# Patient Record
Sex: Female | Born: 1960 | Race: White | Hispanic: No
Health system: Midwestern US, Community
[De-identification: ages and names within clinical notes are randomized; demographics above are authoritative.]

## PROBLEM LIST (undated history)

## (undated) DIAGNOSIS — E1165 Type 2 diabetes mellitus with hyperglycemia: Secondary | ICD-10-CM

## (undated) DIAGNOSIS — Z794 Long term (current) use of insulin: Secondary | ICD-10-CM

## (undated) DIAGNOSIS — E785 Hyperlipidemia, unspecified: Secondary | ICD-10-CM

## (undated) DIAGNOSIS — K219 Gastro-esophageal reflux disease without esophagitis: Secondary | ICD-10-CM

## (undated) DIAGNOSIS — J45909 Unspecified asthma, uncomplicated: Secondary | ICD-10-CM

## (undated) DIAGNOSIS — E119 Type 2 diabetes mellitus without complications: Secondary | ICD-10-CM

## (undated) HISTORY — PX: CHOLECYSTECTOMY: SHX55

## (undated) HISTORY — PX: TONSILLECTOMY: SUR1361

## (undated) HISTORY — PX: APPENDECTOMY: SHX54

## (undated) HISTORY — PX: COLONOSCOPY WITH ESOPHAGOGASTRODUODENOSCOPY (EGD): SHX5779

## (undated) HISTORY — PX: ABDOMINAL HYSTERECTOMY: SHX81

---

## 2015-02-28 ENCOUNTER — Encounter

## 2015-02-28 ENCOUNTER — Ambulatory Visit
Admit: 2015-02-28 | Discharge: 2015-02-28 | Payer: PRIVATE HEALTH INSURANCE | Attending: "Endocrinology | Primary: Family Medicine

## 2015-02-28 DIAGNOSIS — E1165 Type 2 diabetes mellitus with hyperglycemia: Secondary | ICD-10-CM

## 2015-02-28 DIAGNOSIS — Z794 Long term (current) use of insulin: Secondary | ICD-10-CM | POA: Insufficient documentation

## 2015-02-28 NOTE — Patient Instructions (Addendum)
Check blood sugars before meals/breakfast and at bedtime.    Low blood glucose is less than 70     Maintain the log and bring it all your appointments    If the bedtime sugars are less than 100 ,eat a 15 gm snack.    Lantus  75 units at bed time    Novolog or Humalog or Apidra insulin 25 units before breakfast, 25 units before lunch and 25 -30 units before dinner.   If sugars before meals are less than 70 then take half the scheduled dose instead of the full dose    Additional Novolog or Humalog or Apidra  for high blood sugars     150-200 mg   4 units   201-250 mg   8 units   251-300 mg   10 units   301-350 mg   14 units   351-400 mg   18 units         Exercising for 30 minutes at least 5 days per week has been shown to increase the lifespan of diabetics. We encourage an active lifestyle that includes regular exercise.     You may benefit from the Diabetic Treatment Center at [facility] [phone]     For diet information go to www. [email or web address]     Diabetes is the leading cause of blindness in the [place] It is important that you see an eye doctor every year for a dilated retinal exam     Diabetes is the leading cause of amputations in the [place] It is very important that you keep an eye on the condition of you feet. Look for any cuts, calluses, ulcers, fungal infections, rashes, or nail problems.     Diabetics need to be seen several times a year by their physician for fasting labs and monitoring of their diabetes. Prevention is the key to keeping diabetics out of trouble     Obtain a flu shot each Fall      What should you know about eating carbs?  Managing the amount of carbohydrate (carbs) you eat is an important part of healthy meals when you have diabetes. Carbohydrate is found in many foods.  ?? Learn which foods have carbs. And learn the amounts of carbs in different foods.  ?? Bread, cereal, pasta, and rice have about 15 grams of carbs in a serving. A serving is 1 slice of bread (1 ounce), ?? cup of cooked cereal, or  1/3 cup of cooked pasta or rice.  ?? Fruits have 15 grams of carbs in a serving. A serving is 1 small fresh fruit, such as an apple or orange; ?? of a banana; ?? cup of cooked or canned fruit; ?? cup of fruit juice; 1 cup of melon or raspberries; or 2 tablespoons of dried fruit.  ?? Milk and no-sugar-added yogurt have 15 grams of carbs in a serving. A serving is 1 cup of milk or 2/3 cup of no-sugar-added yogurt.  ?? Starchy vegetables have 15 grams of carbs in a serving. A serving is ?? cup of mashed potatoes or sweet potato; 1 cup winter squash; ?? of a small baked potato; ?? cup of cooked beans; or ?? cup cooked corn or green peas.  ?? Learn how much carbs to eat each day and at each meal. A dietitian or CDE can teach you how to keep track of the amount of carbs you eat. This is called carbohydrate counting.  ?? If you are not sure how to count carbohydrate  grams, use the Plate Method to plan meals. It is a good, quick way to make sure that you have a balanced meal. It also helps you spread carbs throughout the day.  ?? Divide your plate by types of foods. Put non-starchy vegetables on half the plate, meat or other protein food on one-quarter of the plate, and a grain or starchy vegetable in the final quarter of the plate. To this you can add a small piece of fruit and 1 cup of milk or yogurt, depending on how many carbs you are supposed to eat at a meal.  ?? Try to eat about the same amount of carbs at each meal. Do not "save up" your daily allowance of carbs to eat at one meal.  ?? Proteins have very little or no carbs per serving. Examples of proteins are beef, chicken, [place], fish, eggs, tofu, cheese, cottage cheese, and peanut butter. A serving size of meat is 3 ounces, which is about the size of a deck of cards. Examples of meat substitute serving sizes (equal to 1 ounce of meat) are 1/4 cup of cottage cheese, 1 egg, 1 tablespoon of peanut butter, and ?? cup of tofu.  How can you eat out and still eat healthy?  ?? Learn to  estimate the serving sizes of foods that have carbohydrate. If you measure food at home, it will be easier to estimate the amount in a serving of restaurant food.  If you eat more carbohydrate at a meal than you had planned, take a walk or do other exercise. This will help lower your blood sugar.

## 2015-02-28 NOTE — Progress Notes (Signed)
[facility] CARE DIABETES AND ENDOCRINOLOGY               Dennison Mascot ,MD        [address] [name] [address] [phone] Fax [phone]               Patient Information  Date:03/01/2015  Name : Christina Dillon 55 y.o.     D.O.B : Jun 11, 1960         Referred by: Milas Hock, MD       CC - DM     History of Present Illness: Christina Dillon is a 55 y.o. female here for initial visit of  Type 2 Diabetes Mellitus.     Type 2 Diabetes was diagnosed in 2010 . End organ effects of diabetes: none.    Cardiovascular risk factors: dyslipidemia, diabetes mellitus, obesity, stress   Monitoring frequency:2 /day and readings run 60 - 190  Hypoglycemia: yes  Eye exam :  no  Weight trend: increasing steadily  Prior visit with dietician: no  Current diet: "unhealthy" diet in general  Current exercise: no regular exercise    Wt Readings from Last 3 Encounters:   02/28/15 207 lb (93.9 kg)       BP Readings from Last 3 Encounters:   02/28/15 130/75           Past Medical History   Diagnosis Date   ??? Kidney stones    ??? Type II diabetes mellitus, uncontrolled (HCC)      Current Outpatient Prescriptions   Medication Sig   ??? insulin glargine (LANTUS SOLOSTAR) 100 unit/mL (3 mL) pen 85 Units by SubCUTAneous route daily.   ??? insulin aspart (NOVOLOG FLEXPEN) 100 unit/mL inpn 20 Units by SubCUTAneous route Before breakfast, lunch, and dinner.   ??? metFORMIN (GLUCOPHAGE) 1,000 mg tablet Take 1,000 mg by mouth two (2) times daily (with meals).   ??? meloxicam (MOBIC) 15 mg tablet Take 15 mg by mouth daily.   ??? CIPROFLOXACIN HCL (CIPRO PO) Take  by mouth.   ??? phenazopyridine (PYRIDIUM) 200 mg tablet Take  by mouth three (3) times daily as needed for Pain.   ??? linaclotide (LINZESS) 145 mcg cap capsule Take  by mouth Daily (before breakfast).     No current facility-administered medications for this visit.      Allergies   Allergen Reactions   ??? Iodinated Contrast Media - Oral And Iv Dye Anaphylaxis   ??? Morphine Anaphylaxis   ??? Pcn  [Penicillins] Anaphylaxis   ??? Codeine Rash and Swelling   ??? Sulfa (Sulfonamide Antibiotics) Rash and Swelling     Throat closes         Review of Systems:  - Constitutional Symptoms: no fevers, no chills, no weight loss  - Eyes: no blurry vision no double vision  - Cardiovascular: no chest pain ,no palpitations  - Respiratory: no cough no shortness of breath  - Gastrointestinal: no dysphagia no  abdominal pain  - Musculoskeletal: no joint pains no  weakness  - Integumentary: no rashes  - Neurological: no numbness, tingling, no  headaches  - Psychiatric: no depression no  anxiety  - Endocrine: no heat or cold intolerance    Physical Examination:   Blood pressure 130/75, pulse 90, temperature 98.5 ??F (36.9 ??C), temperature source Oral, resp. rate 16, height 5\' 1"  (1.549 m), weight 207 lb (93.9 kg), SpO2 95 %. Estimated body mass index is 39.11 kg/(m^2) as calculated from the following:  Height as of this encounter: 5\' 1"  (1.549 m).  -   Weight as of this encounter: 207 lb (93.9 kg).  - General: pleasant, no distress, good eye contact  - HEENT: no pallor, no periorbital edema, EOMI  - Neck: supple, no thyromegaly, no nodules  - Cardiovascular: regular, normal rate, normal S1 and S2, no murmurs  - Respiratory: clear to auscultation bilaterally  - Gastrointestinal: soft, nontender, nondistended,  BS +  - Musculoskeletal: no proximal muscle weakness in upper or lower extremities  - Integumentary: no acanthosis nigricans,no edema, no foot ulcers  - Neurological: intact sensation to monofilament ,alert and oriented  - Psychiatric: normal mood and affect  - Skin: color, texture, turgor normal.       Data Reviewed:     []  Glucose records reviewed.  []  See glucose records for details (to be scanned).  []  A1C  []  Reviewed labs        Assessment/Plan:     1. Type 2 diabetes mellitus with hyperglycemia, with long-term current use of insulin (HCC)        1. 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   Metformin  Trulicity - weight loss discussed ,portion control   Advised to check glucose 2  times daily    Diabetic issues reviewed : glycemic goals , written exchange diet given, low carbohydrate diet, weight control , home glucose monitoring emphasized,  hypoglycemia management and long term diabetic complications discussed.   FLU annually ,Pneumovax ,aspirin daily,annual eye exam,microalbumin      4.Obesity:Body mass index is 39.11 kg/(m^2).  Discussed about the importance of exercise and carbohydrate portion control.          Patient Instructions   Check blood sugars before meals/breakfast and at bedtime.    Low blood glucose is less than 70     Maintain the log and bring it all your appointments    If the bedtime sugars are less than 100 ,eat a 15 gm snack.    Lantus  75 units at bed time    Novolog or Humalog or Apidra insulin 25 units before breakfast, 25 units before lunch and 25 -30 units before dinner.   If sugars before meals are less than 70 then take half the scheduled dose instead of the full dose    Additional Novolog or Humalog or Apidra  for high blood sugars     150-200 mg   4 units   201-250 mg   8 units   251-300 mg   10 units   301-350 mg   14 units   351-400 mg   18 units         Exercising for 30 minutes at least 5 days per week has been shown to increase the lifespan of diabetics. We encourage an active lifestyle that includes regular exercise.     You may benefit from the Diabetic Treatment Center at [facility] [phone]     For diet information go to www. [email or web address]     Diabetes is the leading cause of blindness in the [place] It is important that you see an eye doctor every year for a dilated retinal exam     Diabetes is the leading cause of amputations in the [place] It  is very important  that you keep an eye on the condition of you feet. Look for any cuts, calluses, ulcers, fungal infections, rashes, or nail problems.     Diabetics need to be seen several times a year by their physician for fasting labs and monitoring of their diabetes. Prevention is the key to keeping diabetics out of trouble     Obtain a flu shot each Fall      What should you know about eating carbs?  Managing the amount of carbohydrate (carbs) you eat is an important part of healthy meals when you have diabetes. Carbohydrate is found in many foods.  ?? Learn which foods have carbs. And learn the amounts of carbs in different foods.  ?? Bread, cereal, pasta, and rice have about 15 grams of carbs in a serving. A serving is 1 slice of bread (1 ounce), ?? cup of cooked cereal, or 1/3 cup of cooked pasta or rice.  ?? Fruits have 15 grams of carbs in a serving. A serving is 1 small fresh fruit, such as an apple or orange; ?? of a banana; ?? cup of cooked or canned fruit; ?? cup of fruit juice; 1 cup of melon or raspberries; or 2 tablespoons of dried fruit.  ?? Milk and no-sugar-added yogurt have 15 grams of carbs in a serving. A serving is 1 cup of milk or 2/3 cup of no-sugar-added yogurt.  ?? Starchy vegetables have 15 grams of carbs in a serving. A serving is ?? cup of mashed potatoes or sweet potato; 1 cup winter squash; ?? of a small baked potato; ?? cup of cooked beans; or ?? cup cooked corn or green peas.  ?? Learn how much carbs to eat each day and at each meal. A dietitian or CDE can teach you how to keep track of the amount of carbs you eat. This is called carbohydrate counting.  ?? If you are not sure how to count carbohydrate grams, use the Plate Method to plan meals. It is a good, quick way to make sure that you have a balanced meal. It also helps you spread carbs throughout the day.  ?? Divide your plate by types of foods. Put non-starchy vegetables on half the plate, meat or other protein food on one-quarter of the plate, and a grain or  starchy vegetable in the final quarter of the plate. To this you can add a small piece of fruit and 1 cup of milk or yogurt, depending on how many carbs you are supposed to eat at a meal.  ?? Try to eat about the same amount of carbs at each meal. Do not "save up" your daily allowance of carbs to eat at one meal.  ?? Proteins have very little or no carbs per serving. Examples of proteins are beef, chicken, [place], fish, eggs, tofu, cheese, cottage cheese, and peanut butter. A serving size of meat is 3 ounces, which is about the size of a deck of cards. Examples of meat substitute serving sizes (equal to 1 ounce of meat) are 1/4 cup of cottage cheese, 1 egg, 1 tablespoon of peanut butter, and ?? cup of tofu.  How can you eat out and still eat healthy?  ?? Learn to estimate the serving sizes of foods that have carbohydrate. If you measure food at home, it will be easier to estimate the amount in a serving of restaurant food.  If you eat more carbohydrate at a meal than you had planned, take a walk  or do other exercise. This will help lower your blood sugar.    Follow-up Disposition:  Return in about 2 months (around 04/28/2015).    Thank you for allowing me to participate in the care of this patient.    Dennison Mascot, MD      Patient verbalized understanding

## 2015-02-28 NOTE — Progress Notes (Signed)
Eye exam: couple of years ago  Foot exam: never  Diabetic since 2010    Wt Readings from Last 3 Encounters:   02/28/15 207 lb (93.9 kg)     Temp Readings from Last 3 Encounters:   02/28/15 98.5 ??F (36.9 ??C) (Oral)     BP Readings from Last 3 Encounters:   02/28/15 130/75     Pulse Readings from Last 3 Encounters:   02/28/15 90

## 2015-03-01 DIAGNOSIS — Z6839 Body mass index (BMI) 39.0-39.9, adult: Secondary | ICD-10-CM | POA: Insufficient documentation

## 2015-03-03 MED ORDER — DULAGLUTIDE 0.75 MG/0.5 ML SUBCUTANEOUS PEN INJECTOR
0.75 mg/0.5 mL | INJECTION | SUBCUTANEOUS | 5 refills | Status: DC
Start: 2015-03-03 — End: 2016-02-28

## 2015-05-02 ENCOUNTER — Ambulatory Visit
Admit: 2015-05-02 | Discharge: 2015-05-02 | Payer: PRIVATE HEALTH INSURANCE | Attending: "Endocrinology | Primary: Family Medicine

## 2015-05-02 ENCOUNTER — Encounter

## 2015-05-02 DIAGNOSIS — E1165 Type 2 diabetes mellitus with hyperglycemia: Secondary | ICD-10-CM

## 2015-05-02 LAB — AMB POC HEMOGLOBIN A1C: Hemoglobin A1c (POC): 7 %

## 2015-05-02 LAB — AMB POC GLUCOSE, QUANTITATIVE, BLOOD: Glucose POC: 182 mg/dL

## 2015-05-02 MED ORDER — INSULIN GLARGINE 100 UNIT/ML (3 ML) SUB-Q PEN
100 unit/mL (3 mL) | Freq: Every day | SUBCUTANEOUS | 5 refills | Status: DC
Start: 2015-05-02 — End: 2015-05-06

## 2015-05-02 NOTE — Patient Instructions (Signed)
Check blood sugars before meals/breakfast and at bedtime.    Low blood glucose is less than 70     Maintain the log and bring it all your appointments    If the bedtime sugars are less than 100 ,eat a 15 gm snack.    Lantus  70 units at bed time      Stop Humalog [facility]/NOvolog       Start Trulicity once a week     If you have blood sugars less than 80 , then cut back on lantus by 5 units at a time and keep monitoring

## 2015-05-02 NOTE — Progress Notes (Signed)
Christina Dillon is a 55 y.o. female here for   Chief Complaint   Patient presents with   ??? Diabetes       Functional glucose monitor and record keeping system? - yes  Eye exam within last year? - no  Foot exam within last year? - no    No results found for: HBA1C, HGBE8, HBA1CPOC, HBA1CEXT    Wt Readings from Last 3 Encounters:   02/28/15 207 lb (93.9 kg)     Temp Readings from Last 3 Encounters:   02/28/15 98.5 ??F (36.9 ??C) (Oral)     BP Readings from Last 3 Encounters:   02/28/15 130/75     Pulse Readings from Last 3 Encounters:   02/28/15 90

## 2015-05-02 NOTE — Progress Notes (Signed)
[facility] CARE DIABETES AND ENDOCRINOLOGY               Dennison Mascot ,MD        [address] [place] [address] [phone] Fax [phone]               Patient Information  Date:05/04/2015  Name : Christina Dillon 55 y.o.     D.O.B : Apr 29, 1960         Referred by: Milas Hock, DO       CC - DM     History of Present Illness: Christina Dillon is a 55 y.o. female here for fu  of  Type 2 Diabetes Mellitus.     Type 2 Diabetes was diagnosed in 2010 . End organ effects of diabetes: none.    Cardiovascular risk factors: dyslipidemia, diabetes mellitus, obesity, stress   Monitoring frequency:2 /day and readings run 60 - 130 ,   Hypoglycemia: yes  Checking glucose at home      She has changed the diet and has hypos now             Current exercise: no regular exercise    Wt Readings from Last 3 Encounters:   05/02/15 206 lb 14.4 oz (93.8 kg)   02/28/15 207 lb (93.9 kg)       BP Readings from Last 3 Encounters:   05/02/15 128/77   02/28/15 130/75           Past Medical History:   Diagnosis Date   ??? Kidney stones    ??? Type II diabetes mellitus, uncontrolled (HCC)      Current Outpatient Prescriptions   Medication Sig   ??? oxybutynin chloride XL (DITROPAN XL) 5 mg CR tablet    ??? insulin aspart (NOVOLOG FLEXPEN) 100 unit/mL inpn 20 Units by SubCUTAneous route Before breakfast, lunch, and dinner.   ??? metFORMIN (GLUCOPHAGE) 1,000 mg tablet Take 1,000 mg by mouth two (2) times daily (with meals).   ??? phenazopyridine (PYRIDIUM) 200 mg tablet Take  by mouth three (3) times daily as needed for Pain.   ??? linaclotide (LINZESS) 145 mcg cap capsule Take  by mouth Daily (before breakfast).   ??? insulin glargine (LANTUS SOLOSTAR) 100 unit/mL (3 mL) pen 70 Units by SubCUTAneous route daily.   ??? dulaglutide (TRULICITY) 0.75 mg/0.5 mL sub-q pen 0.5 mL by SubCUTAneous route every seven (7) days.     No current facility-administered medications for this visit.      Allergies   Allergen Reactions   ??? Iodinated Contrast Media - Oral  And Iv Dye Anaphylaxis   ??? Morphine Anaphylaxis   ??? Pcn [Penicillins] Anaphylaxis   ??? Codeine Rash and Swelling   ??? Sulfa (Sulfonamide Antibiotics) Rash and Swelling     Throat closes         Review of Systems:  -   - Musculoskeletal: no joint pains no  weakness  - Integumentary: no rashes  - Neurological: no numbness, tingling, no  headaches  - Psychiatric: no depression no  anxiety  - Endocrine: no heat or cold intolerance    Physical Examination:   Blood pressure 128/77, pulse 95, temperature 98.6 ??F (37 ??C), temperature source Oral, resp. rate 20, height  (1.549 m), weight 206 lb 14.4 oz (93.8 kg). Estimated body mass index is 39.09 kg/(m^2) as calculated from the following:    Height as of this encounter:  (1.549 m).  -   Weight as  of this encounter: 206 lb 14.4 oz (93.8 kg).  - General: pleasant, no distress, good eye contact  - HEENT: no pallor, no periorbital edema, EOMI  - Neck: supple, no thyromegaly, no nodules  - Cardiovascular: regular, normal rate, normal S1 and S2, no murmurs  - Respiratory: clear to auscultation bilaterally  - Gastrointestinal: soft, nontender, nondistended,  BS +  - Musculoskeletal: no proximal muscle weakness in upper or lower extremities  -   - Psychiatric: normal mood and affect  - Skin: color, texture, turgor normal.       Data Reviewed:     []  Glucose records reviewed.  []  See glucose records for details (to be scanned).  []  A1C  []  Reviewed labs        Assessment/Plan:     1. Type 2 diabetes mellitus with hyperglycemia, with long-term current use of insulin (HCC)        1. 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  Discussed about the importance of exercise and carbohydrate portion control.          Patient Instructions   Check blood sugars before meals/breakfast and at bedtime.    Low blood glucose is less than 70     Maintain the log and bring it all your appointments    If the bedtime sugars are less than 100 ,eat a 15 gm snack.    Lantus  70 units at bed time      Stop Humalog [facility]/NOvolog       Start Trulicity once a week     If you have blood sugars less than 80 , then cut back on lantus by 5 units at a time and keep monitoring     Follow-up Disposition:  Return in about 3 months (around 08/02/2015).    Thank you for allowing me to participate in the care of this patient.    Dennison MascotUma Easton Sivertson, MD      Patient verbalized understanding

## 2015-05-04 DIAGNOSIS — I10 Essential (primary) hypertension: Secondary | ICD-10-CM | POA: Insufficient documentation

## 2015-05-06 ENCOUNTER — Encounter

## 2015-05-06 MED ORDER — INSULIN DETEMIR 100 UNIT/ML (3 ML) SUB-Q PEN
100 unit/mL (3 mL) | Freq: Every day | SUBCUTANEOUS | 11 refills | Status: DC
Start: 2015-05-06 — End: 2015-11-05

## 2015-05-07 NOTE — Telephone Encounter (Signed)
Patient called sugars have been out of ranges. Was wondering if you can help. [phone]

## 2015-05-12 NOTE — Telephone Encounter (Signed)
Called patient and left message.

## 2015-06-30 NOTE — Telephone Encounter (Signed)
Attempted to call patient left voicemail and call back number.

## 2015-07-01 NOTE — Telephone Encounter (Signed)
Spoke with patient stated blood sugars are still fluctuating. Patient verbalizes what to do for low blood sugar. Said she spoke with Dr.Muthyala when her husband had an appointment and felt better about blood sugars. Instructed patient to call for any issues. Patient verbalized understanding. Patient has follow up appointment on 07/31/2015.

## 2015-07-31 ENCOUNTER — Encounter: Admit: 2015-07-31 | Discharge: 2015-07-31 | Payer: PRIVATE HEALTH INSURANCE | Primary: Family Medicine

## 2015-07-31 DIAGNOSIS — E1165 Type 2 diabetes mellitus with hyperglycemia: Secondary | ICD-10-CM

## 2015-08-01 LAB — METABOLIC PANEL, COMPREHENSIVE
A-G Ratio: 1.8 (ref 1.2–2.2)
ALT (SGPT): 19 IU/L (ref 0–32)
AST (SGOT): 13 IU/L (ref 0–40)
Albumin: 4.1 g/dL (ref 3.5–5.5)
Alk. phosphatase: 76 IU/L (ref 39–117)
BUN/Creatinine ratio: 20 (ref 9–23)
BUN: 12 mg/dL (ref 6–24)
Bilirubin, total: 0.3 mg/dL (ref 0.0–1.2)
CO2: 24 mmol/L (ref 18–29)
Calcium: 8.9 mg/dL (ref 8.7–10.2)
Chloride: 105 mmol/L (ref 96–106)
Creatinine: 0.6 mg/dL (ref 0.57–1.00)
GFR est AA: 121 mL/min/{1.73_m2} (ref >59)
GFR est non-AA: 105 mL/min/{1.73_m2} (ref >59)
GLOBULIN, TOTAL: 2.3 g/dL (ref 1.5–4.5)
Glucose: 83 mg/dL (ref 65–99)
Potassium: 4.1 mmol/L (ref 3.5–5.2)
Protein, total: 6.4 g/dL (ref 6.0–8.5)
Sodium: 145 mmol/L — ABNORMAL HIGH (ref 134–144)

## 2015-08-01 LAB — MICROALBUMIN, UR, RAND W/ MICROALB/CREAT RATIO
Creatinine, urine random: 125.1 mg/dL
Microalb/Creat ratio (ug/mg creat.): 9.9 mg/g creat (ref 0.0–30.0)
Microalbumin, urine: 12.4 ug/mL

## 2015-08-01 LAB — HEMOGLOBIN A1C WITH EAG
Estimated average glucose: 146 mg/dL
Hemoglobin A1c: 6.7 % — ABNORMAL HIGH (ref 4.8–5.6)

## 2015-08-01 LAB — LDL, DIRECT: LDL,Direct: 118 mg/dL — ABNORMAL HIGH (ref 0–99)

## 2015-08-07 ENCOUNTER — Encounter: Attending: "Endocrinology | Primary: Family Medicine

## 2015-10-05 MED ORDER — NOVOLOG FLEXPEN U-100 INSULIN ASPART 100 UNIT/ML (3 ML) SUBCUTANEOUS
100 unit/mL (3 mL) | SUBCUTANEOUS | 1 refills | Status: DC
Start: 2015-10-05 — End: 2016-06-24

## 2015-11-05 ENCOUNTER — Ambulatory Visit
Admit: 2015-11-05 | Discharge: 2015-11-05 | Payer: PRIVATE HEALTH INSURANCE | Attending: "Endocrinology | Primary: Family Medicine

## 2015-11-05 DIAGNOSIS — E1165 Type 2 diabetes mellitus with hyperglycemia: Secondary | ICD-10-CM

## 2015-11-05 DIAGNOSIS — E162 Hypoglycemia, unspecified: Secondary | ICD-10-CM | POA: Insufficient documentation

## 2015-11-05 LAB — AMB POC HEMOGLOBIN A1C: Hemoglobin A1c (POC): 6.4 %

## 2015-11-05 LAB — AMB POC GLUCOSE, QUANTITATIVE, BLOOD: Glucose POC: 75 mg/dL

## 2015-11-05 MED ORDER — INSULIN DETEMIR 100 UNIT/ML (3 ML) SUB-Q PEN
100 unit/mL (3 mL) | Freq: Every day | SUBCUTANEOUS | 11 refills | Status: DC
Start: 2015-11-05 — End: 2015-12-11

## 2015-11-05 NOTE — Patient Instructions (Addendum)
Check blood sugars before meals/breakfast and at bedtime.    Low blood glucose is less than 70     Maintain the log and bring it all your appointments    If the bedtime sugars are less than 100 ,eat a 15 gm snack.    Levemir 60 units at bed time on work nights     Levemir 70 units at bedtime when not working     Novolog 15 units before meals

## 2015-11-05 NOTE — Progress Notes (Signed)
Leia Alfamela Kendrix is a 55 y.o. female here for   Chief Complaint   Patient presents with   ??? Diabetes       Functional glucose monitor and record keeping system? - yes  Eye exam within last year? - no  Foot exam within last year? - no    1. Have you been to the ER, urgent care clinic since your last visit?  Hospitalized since your last visit? -[facility] for kidney stones and septic UTI    2. Have you seen or consulted any other health care providers [facility] since your last visit?  Include any pap smears or colon screening.-PCP      Lab Results   Component Value Date/Time    Hemoglobin A1c 6.7 07/31/2015 09:23 AM    Hemoglobin A1c (POC) 7 05/02/2015 01:47 PM       Wt Readings from Last 3 Encounters:   05/02/15 206 lb 14.4 oz (93.8 kg)   02/28/15 207 lb (93.9 kg)     Temp Readings from Last 3 Encounters:   05/02/15 98.6 ??F (37 ??C) (Oral)   02/28/15 98.5 ??F (36.9 ??C) (Oral)     BP Readings from Last 3 Encounters:   05/02/15 128/77   02/28/15 130/75     Pulse Readings from Last 3 Encounters:   05/02/15 95   02/28/15 90

## 2015-11-05 NOTE — Progress Notes (Signed)
[facility]               Dennison MascotUma  Ellese Julius ,MD        [address] [place] [id number] [phone]:[phone] Fax [phone]               Patient Information  Date:11/05/2015  Name : Christina Dillon 55 y.o.     D.O.B : 1960-12-07         Referred by: Milas HockJohn Lewis, DO       CC - DM     History of Present Illness: Christina Alfamela Tewell is a 55 y.o. female here for fu  of  Type 2 Diabetes Mellitus.     Type 2 Diabetes was diagnosed in 2010 . End organ effects of diabetes: none.    Cardiovascular risk factors: dyslipidemia, diabetes mellitus, obesity, stress   Monitoring frequency:2 /day and readings run 60 - 130 ,   Hypoglycemia: yes  Checking glucose at home   when she works nights she has hypoglycemia  Still skipping meals and today she has not eaten breakfast or lunch       Current exercise: no regular exercise    Wt Readings from Last 3 Encounters:   11/05/15 197 lb 6.4 oz (89.5 kg)   05/02/15 206 lb 14.4 oz (93.8 kg)   02/28/15 207 lb (93.9 kg)       BP Readings from Last 3 Encounters:   11/05/15 127/79   05/02/15 128/77   02/28/15 130/75           Past Medical History:   Diagnosis Date   ??? Kidney stones    ??? Type II diabetes mellitus, uncontrolled (HCC)      Current Outpatient Prescriptions   Medication Sig   ??? meclizine (ANTIVERT) 25 mg tablet Take 25 mg by mouth.   ??? NOVOLOG FLEXPEN 100 unit/mL inpn INJECT 15 UNITS SUBCUTANEOUSLY WITH MEALS   ??? insulin detemir (LEVEMIR FLEXTOUCH) 100 unit/mL (3 mL) inpn 70 Units by SubCUTAneous route daily.   ??? oxybutynin chloride XL (DITROPAN XL) 5 mg CR tablet    ??? metFORMIN (GLUCOPHAGE) 1,000 mg tablet Take 1,000 mg by mouth two (2) times daily (with meals).   ??? phenazopyridine (PYRIDIUM) 200 mg tablet Take  by mouth three (3) times daily as needed for Pain.   ??? linaclotide (LINZESS) 145 mcg cap capsule Take  by mouth Daily (before breakfast).   ??? dulaglutide (TRULICITY) 0.75 mg/0.5 mL sub-q pen 0.5 mL by SubCUTAneous route every seven (7) days.     No  current facility-administered medications for this visit.      Allergies   Allergen Reactions   ??? Iodinated Contrast- Oral And Iv Dye Anaphylaxis   ??? Morphine Anaphylaxis   ??? Pcn [Penicillins] Anaphylaxis   ??? Codeine Rash and Swelling   ??? Sulfa (Sulfonamide Antibiotics) Rash and Swelling     Throat closes         Review of Systems:  -   - Musculoskeletal: no joint pains no  weakness  - Integumentary: no rashes  - Neurological: no numbness, tingling, no  headaches  - Psychiatric: no depression no  anxiety  - Endocrine: no heat or cold intolerance    Physical Examination:   Blood pressure 127/79, pulse 87, temperature 98.6 ??F (37 ??C), temperature source Oral, resp. rate 16, height 5\' 1"  (1.549 m), weight 197 lb 6.4 oz (89.5 kg), SpO2 96 %. Estimated body mass index is 37.3 kg/(m^2)  as calculated from the following:    Height as of this encounter: 5\' 1"  (1.549 m).  -   Weight as of this encounter: 197 lb 6.4 oz (89.5 kg).  - General: pleasant, no distress, good eye contact  - HEENT: no pallor, no periorbital edema, EOMI  - Neck: supple, no thyromegaly, no nodules  - Cardiovascular: regular, normal rate, normal S1 and S2, no murmurs  - Respiratory: clear to auscultation bilaterally  - Gastrointestinal: soft, nontender, nondistended,  BS +  - Musculoskeletal: no proximal muscle weakness in upper or lower extremities  -   - Psychiatric: normal mood and affect  - Skin: color, texture, turgor normal.       Data Reviewed:     []  Glucose records reviewed.  []  See glucose records for details (to be scanned).  []  A1C  []  Reviewed labs        Assessment/Plan:     1. Type 2 diabetes mellitus with hyperglycemia, with long-term current use of insulin (HCC)    2. Essential hypertension        1. Type 2 Diabetes Mellitus with no nephropathy,neuropathy,retinopathy  Lab Results   Component Value Date/Time    Hemoglobin A1c 6.7 07/31/2015 09:23 AM    Hemoglobin A1c (POC) 7 05/02/2015 01:47 PM     Levemir 60 units at bed time on work  nights     Levemir 70 units at bedtime when not working     Metformin  Trulicity -  Advised to check glucose 2  times daily    FLU annually ,Pneumovax ,aspirin daily,annual eye exam,microalbumin      HTN - controlled     4.Obesity:Body mass index is 37.3 kg/(m^2).  Discussed about the importance of exercise and carbohydrate portion control.          There are no Patient Instructions on file for this visit.  Follow-up Disposition: Not on File    Thank you for allowing me to participate in the care of this patient.    Dennison Mascot, MD      Patient verbalized understanding

## 2015-12-11 ENCOUNTER — Encounter

## 2015-12-11 MED ORDER — INSULIN DETEMIR 100 UNIT/ML (3 ML) SUB-Q PEN
100 unit/mL (3 mL) | Freq: Every day | SUBCUTANEOUS | 3 refills | Status: DC
Start: 2015-12-11 — End: 2016-07-03

## 2016-02-28 ENCOUNTER — Encounter

## 2016-02-28 MED ORDER — TRULICITY 0.75 MG/0.5 ML SUBCUTANEOUS PEN INJECTOR
0.75 mg/0.5 mL | INJECTION | SUBCUTANEOUS | 3 refills | Status: DC
Start: 2016-02-28 — End: 2016-07-25

## 2016-03-03 ENCOUNTER — Encounter: Admit: 2016-03-03 | Discharge: 2016-03-03 | Payer: PRIVATE HEALTH INSURANCE | Primary: Family Medicine

## 2016-03-03 DIAGNOSIS — E1165 Type 2 diabetes mellitus with hyperglycemia: Secondary | ICD-10-CM

## 2016-03-04 LAB — LIPID PANEL
Cholesterol, total: 211 mg/dL — ABNORMAL HIGH (ref 100–199)
HDL Cholesterol: 49 mg/dL (ref >39)
LDL, calculated: 133 mg/dL — ABNORMAL HIGH (ref 0–99)
Triglyceride: 143 mg/dL (ref 0–149)
VLDL, calculated: 29 mg/dL (ref 5–40)

## 2016-03-04 LAB — MICROALBUMIN, UR, RAND W/ MICROALB/CREAT RATIO
Creatinine, urine random: 87.4 mg/dL
Microalb/Creat ratio (ug/mg creat.): 22.2 mg/g creat (ref 0.0–30.0)
Microalbumin, urine: 19.4 ug/mL

## 2016-03-04 LAB — METABOLIC PANEL, COMPREHENSIVE
A-G Ratio: 1.6 (ref 1.2–2.2)
ALT (SGPT): 24 IU/L (ref 0–32)
AST (SGOT): 14 IU/L (ref 0–40)
Albumin: 4.1 g/dL (ref 3.5–5.5)
Alk. phosphatase: 76 IU/L (ref 39–117)
BUN/Creatinine ratio: 17 (ref 9–23)
BUN: 10 mg/dL (ref 6–24)
Bilirubin, total: 0.2 mg/dL (ref 0.0–1.2)
CO2: 22 mmol/L (ref 18–29)
Calcium: 9.1 mg/dL (ref 8.7–10.2)
Chloride: 106 mmol/L (ref 96–106)
Creatinine: 0.59 mg/dL (ref 0.57–1.00)
GFR est AA: 121 mL/min/{1.73_m2} (ref >59)
GFR est non-AA: 105 mL/min/{1.73_m2} (ref >59)
GLOBULIN, TOTAL: 2.5 g/dL (ref 1.5–4.5)
Glucose: 96 mg/dL (ref 65–99)
Potassium: 4.2 mmol/L (ref 3.5–5.2)
Protein, total: 6.6 g/dL (ref 6.0–8.5)
Sodium: 146 mmol/L — ABNORMAL HIGH (ref 134–144)

## 2016-03-04 LAB — DIABETES PATIENT EDUCATION

## 2016-03-04 LAB — HEMOGLOBIN A1C WITH EAG
Estimated average glucose: 171 mg/dL
Hemoglobin A1c: 7.6 % — ABNORMAL HIGH (ref 4.8–5.6)

## 2016-03-04 LAB — CVD REPORT

## 2016-03-10 ENCOUNTER — Ambulatory Visit
Admit: 2016-03-10 | Discharge: 2016-03-10 | Payer: PRIVATE HEALTH INSURANCE | Attending: "Endocrinology | Primary: Family Medicine

## 2016-03-10 ENCOUNTER — Encounter

## 2016-03-10 DIAGNOSIS — E1165 Type 2 diabetes mellitus with hyperglycemia: Secondary | ICD-10-CM

## 2016-03-10 MED ORDER — PRAVASTATIN 20 MG TAB
20 mg | ORAL_TABLET | Freq: Every evening | ORAL | 5 refills | Status: DC
Start: 2016-03-10 — End: 2016-06-10

## 2016-03-10 MED ORDER — LANCETS 33 GAUGE
33 gauge | 11 refills | Status: DC
Start: 2016-03-10 — End: 2017-06-23

## 2016-03-10 MED ORDER — BLOOD SUGAR DIAGNOSTIC TEST STRIPS
ORAL_STRIP | 11 refills | Status: DC
Start: 2016-03-10 — End: 2017-06-15

## 2016-03-10 NOTE — Progress Notes (Signed)
Christina Dillon is a 56 y.o. female here for   Chief Complaint   Patient presents with   ??? Diabetes       Functional glucose monitor and record keeping system? - yes  Eye exam within last year? - no, need referral      1. Have you been to the ER, urgent care clinic since your last visit?  Hospitalized since your last visit? [facility] for Kidney stones, sepsis, mrsa , in Aug 2017    2. Have you seen or consulted any other health care providers outside of the [facility] since your last visit?  Include any pap smears or colon screening.-PCP      Lab Results   Component Value Date/Time    Hemoglobin A1c 7.6 03/03/2016 08:41 AM    Hemoglobin A1c (POC) 6.4 11/05/2015 01:33 PM       Wt Readings from Last 3 Encounters:   11/05/15 197 lb 6.4 oz (89.5 kg)   05/02/15 206 lb 14.4 oz (93.8 kg)   02/28/15 207 lb (93.9 kg)     Temp Readings from Last 3 Encounters:   11/05/15 98.6 ??F (37 ??C) (Oral)   05/02/15 98.6 ??F (37 ??C) (Oral)   02/28/15 98.5 ??F (36.9 ??C) (Oral)     BP Readings from Last 3 Encounters:   11/05/15 127/79   05/02/15 128/77   02/28/15 130/75     Pulse Readings from Last 3 Encounters:   11/05/15 87   05/02/15 95   02/28/15 90

## 2016-03-10 NOTE — Patient Instructions (Signed)
Check blood sugars before meals/breakfast and at bedtime.    Low blood glucose is less than 70     Maintain the log and bring it all your appointments    If the bedtime sugars are less than 100 ,eat a 15 gm snack.    Levemir 60 units at bed time     Novolog 15 units before meals     When glucose is less than 70 - no Novolog

## 2016-03-10 NOTE — Progress Notes (Signed)
[facility]               Dennison MascotUma  Jamariya Davidoff ,MD        [address] [place] [place] [id number] [phone]:[phone] Fax [phone]               Patient Information  Date:03/11/2016  Name : Christina Dillon 56 y.o.     D.O.B : July 27, 1960         Referred by: Milas HockJohn Lewis, DO       CC - DM     History of Present Illness: Christina Alfamela Gloor is a 56 y.o. female here for fu  of  Type 2 Diabetes Mellitus.     Type 2 Diabetes was diagnosed in 2010 . End organ effects of diabetes: none.    Cardiovascular risk factors: dyslipidemia, diabetes mellitus, obesity, stress   Monitoring frequency:2 /day   Hypoglycemia: yes  Checking glucose at home,   when she works nights she has hypoglycemia, missing meals,   She wants to keep her blood glucose 80-100   She has not eaten anything today.  The last 3 weeks she is off work, no hypoglycemia      Current exercise: no regular exercise    Wt Readings from Last 3 Encounters:   03/10/16 215 lb 11.2 oz (97.8 kg)   11/05/15 197 lb 6.4 oz (89.5 kg)   05/02/15 206 lb 14.4 oz (93.8 kg)       BP Readings from Last 3 Encounters:   03/10/16 146/73   11/05/15 127/79   05/02/15 128/77           Past Medical History:   Diagnosis Date   ??? Kidney stones    ??? Type II diabetes mellitus, uncontrolled (HCC)      Current Outpatient Prescriptions   Medication Sig   ??? TRULICITY 0.75 mg/0.5 mL sub-q pen INJECT 0.5ML SUBCUTANEOUSLY EVERY 7 DAYS   ??? insulin detemir (LEVEMIR FLEXTOUCH) 100 unit/mL (3 mL) inpn 60 Units by SubCUTAneous route daily.   ??? meclizine (ANTIVERT) 25 mg tablet Take 25 mg by mouth.   ??? NOVOLOG FLEXPEN 100 unit/mL inpn INJECT 15 UNITS SUBCUTANEOUSLY WITH MEALS   ??? oxybutynin chloride XL (DITROPAN XL) 5 mg CR tablet    ??? metFORMIN (GLUCOPHAGE) 1,000 mg tablet Take 1,000 mg by mouth two (2) times daily (with meals).   ??? phenazopyridine (PYRIDIUM) 200 mg tablet Take  by mouth three (3) times daily as needed for Pain.   ??? linaclotide (LINZESS) 145 mcg cap capsule Take  by  mouth Daily (before breakfast).   ??? glucose blood VI test strips (ONETOUCH VERIO) strip Test 3 times daily Dx Code: E11.65   ??? lancets (ONE TOUCH DELICA) 33 gauge misc Test 3 times daily Dx Code: E11.65   ??? pravastatin (PRAVACHOL) 20 mg tablet Take 1 Tab by mouth nightly.     No current facility-administered medications for this visit.      Allergies   Allergen Reactions   ??? Iodinated Contrast- Oral And Iv Dye Anaphylaxis   ??? Morphine Anaphylaxis   ??? Pcn [Penicillins] Anaphylaxis   ??? Codeine Rash and Swelling   ??? Sulfa (Sulfonamide Antibiotics) Rash and Swelling     Throat closes         Review of Systems:    - Constitutional Symptoms: no fevers  - Eyes: no blurry vision no double vision  - Gastrointestinal: no dysphagia ,  abdominal pain  - Musculoskeletal: Positive  for joint pains no  weakness  - Integumentary: no rashes  - Neurological: no numbness, tingling,   - CVS - no chest pain  - RS - no sob  - Neurological: no numbness, tingling, or headaches  - Psychiatric: no depression or anxiety    -     Physical Examination:   Blood pressure 146/73, pulse 92, temperature 96.8 ??F (36 ??C), temperature source Oral, resp. rate 16, height 5\' 1"  (1.549 m), weight 215 lb 11.2 oz (97.8 kg), SpO2 99 %. Estimated body mass index is 40.76 kg/(m^2) as calculated from the following:    Height as of this encounter: 5\' 1"  (1.549 m).  -   Weight as of this encounter: 215 lb 11.2 oz (97.8 kg).  - General: pleasant, no distress, good eye contact  - HEENT: no pallor, no periorbital edema, EOMI  - Neck: supple, no thyromegaly, no nodules  - Cardiovascular: regular, normal rate, normal S1 and S2, no murmurs  - Respiratory: clear to auscultation bilaterally  - Gastrointestinal: soft, nontender, nondistended,  BS +  - Musculoskeletal: no proximal muscle weakness in upper or lower extremities  -   - Psychiatric: normal mood and affect  - Skin: color, texture, turgor normal.     Diabetic foot exam: January 2018    Left:     Vibratory  sensation normal    Filament test normal sensation with micro filament   Pulse DP: 1+    Deformities: None  Right:    Vibratory sensation normal   Filament test normal sensation with micro filament   Pulse DP: 1+   Deformities: None      Data Reviewed:     []  Glucose records reviewed.  []  See glucose records for details (to be scanned).  []  A1C  []  Reviewed labs        Assessment/Plan:     1. Type 2 diabetes mellitus with hyperglycemia, with long-term current use of insulin (HCC)    2. Essential hypertension        1. Type 2 Diabetes Mellitus   Lab Results   Component Value Date/Time    Hemoglobin A1c 7.6 03/03/2016 08:41 AM    Hemoglobin A1c (POC) 6.4 11/05/2015 01:33 PM     Levemir 60 units at bed time  NovoLog 15 units before each meal.  Risk of hypoglycemia discussed, decrease Levemir.  We discussed about not missing meals.  She wants A1c less than 5.5, discussed at length about complications of hypoglycemia, best option is to cut down the carbohydrates, lose 30 pounds and limit the amount of insulin  Metformin  Trulicity -  Advised to check glucose 2  times daily    FLU annually ,Pneumovax ,aspirin daily,annual eye exam,microalbumin      HTN - controlled     Mixed hyperlipidemia: Statin    4.Obesity:Body mass index is 40.76 kg/(m^2).  Discussed about the importance of exercise and carbohydrate portion control.    #5 hypoglycemia    Spent more than 40 minutes face-to-face with patient of which greater than 50% of the visit was spent in counseling, reviewed glucose log, and counseling regarding adjustment of insulin regimen.      Patient Instructions   Check blood sugars before meals/breakfast and at bedtime.    Low blood glucose is less than 70     Maintain the log and bring it all your appointments    If the bedtime sugars are less than 100 ,eat a 15 gm snack.  Levemir 60 units at bed time     Novolog 15 units before meals     When glucose is less than 70 - no Novolog       Follow-up Disposition:  Return in  about 3 months (around 06/08/2016).    Thank you for allowing me to participate in the care of this patient.    Dennison Mascot, MD      Patient verbalized understanding

## 2016-06-10 ENCOUNTER — Encounter

## 2016-06-10 MED ORDER — PRAVASTATIN 20 MG TAB
20 mg | ORAL_TABLET | Freq: Every evening | ORAL | 3 refills | Status: DC
Start: 2016-06-10 — End: 2016-12-14

## 2016-06-14 ENCOUNTER — Telehealth

## 2016-06-14 NOTE — Telephone Encounter (Signed)
Order placed for pt,  per Verbal Order with read back from Dr Muthyala 06/14/2016

## 2016-06-16 ENCOUNTER — Encounter: Admit: 2016-06-16 | Discharge: 2016-06-16 | Payer: PRIVATE HEALTH INSURANCE | Primary: Family Medicine

## 2016-06-16 DIAGNOSIS — E1165 Type 2 diabetes mellitus with hyperglycemia: Secondary | ICD-10-CM

## 2016-06-18 LAB — MICROALBUMIN, UR, RAND W/ MICROALB/CREAT RATIO
Creatinine, urine random: 176.1 mg/dL
Microalb/Creat ratio (ug/mg creat.): 8.2 mg/g creat (ref 0.0–30.0)
Microalbumin, urine: 14.5 ug/mL

## 2016-06-18 LAB — METABOLIC PANEL, COMPREHENSIVE
A-G Ratio: 1.6 (ref 1.2–2.2)
ALT (SGPT): 18 IU/L (ref 0–32)
AST (SGOT): 16 IU/L (ref 0–40)
Albumin: 4.1 g/dL (ref 3.5–5.5)
Alk. phosphatase: 82 IU/L (ref 39–117)
BUN/Creatinine ratio: 30 — ABNORMAL HIGH (ref 9–23)
BUN: 22 mg/dL (ref 6–24)
Bilirubin, total: <0.2 mg/dL (ref 0.0–1.2)
CO2: 26 mmol/L (ref 18–29)
Calcium: 9.4 mg/dL (ref 8.7–10.2)
Chloride: 101 mmol/L (ref 96–106)
Creatinine: 0.74 mg/dL (ref 0.57–1.00)
GFR est AA: 107 mL/min/{1.73_m2} (ref >59)
GFR est non-AA: 93 mL/min/{1.73_m2} (ref >59)
GLOBULIN, TOTAL: 2.6 g/dL (ref 1.5–4.5)
Glucose: 46 mg/dL — ABNORMAL LOW (ref 65–99)
Potassium: 3.5 mmol/L (ref 3.5–5.2)
Protein, total: 6.7 g/dL (ref 6.0–8.5)
Sodium: 145 mmol/L — ABNORMAL HIGH (ref 134–144)

## 2016-06-18 LAB — LIPID PANEL
Cholesterol, total: 155 mg/dL (ref 100–199)
HDL Cholesterol: 46 mg/dL (ref >39)
LDL, calculated: 90 mg/dL (ref 0–99)
Triglyceride: 93 mg/dL (ref 0–149)
VLDL, calculated: 19 mg/dL (ref 5–40)

## 2016-06-18 LAB — CVD REPORT

## 2016-06-18 LAB — DIABETES PATIENT EDUCATION

## 2016-06-18 LAB — HEMOGLOBIN A1C WITH EAG
Estimated average glucose: 114 mg/dL
Hemoglobin A1c: 5.6 % (ref 4.8–5.6)

## 2016-06-23 ENCOUNTER — Ambulatory Visit
Admit: 2016-06-23 | Discharge: 2016-06-23 | Payer: PRIVATE HEALTH INSURANCE | Attending: "Endocrinology | Primary: Family Medicine

## 2016-06-23 ENCOUNTER — Encounter

## 2016-06-23 DIAGNOSIS — E1165 Type 2 diabetes mellitus with hyperglycemia: Secondary | ICD-10-CM

## 2016-06-23 MED ORDER — METFORMIN 1,000 MG TAB
1000 mg | ORAL_TABLET | Freq: Two times a day (BID) | ORAL | 3 refills | Status: AC
Start: 2016-06-23 — End: ?

## 2016-06-23 NOTE — Progress Notes (Signed)
[facility]               Dennison MascotUma  Deshondra Worst ,MD        [address] [place] [place] [id number] [phone] Fax [phone]               Patient Information  Date:06/24/2016  Name : Christina Dillon 56 y.o.     D.O.B : 1960/07/06         Referred by: Milas HockJohn Lewis, DO       CC - DM     History of Present Illness: Christina Alfamela Doland is a 56 y.o. female here for fu  of  Type 2 Diabetes Mellitus.     Type 2 Diabetes was diagnosed in 2010 . End organ effects of diabetes: none.    Cardiovascular risk factors: dyslipidemia, diabetes mellitus, obesity, stress   Monitoring frequency:2 /day she has lost weight, continues to take the same  i amount nsulin  she is having hypoglycemia    I have discussed with her multiple times if she has blood glucose less than 70 more than 2 times she has to call the office which she did not.  Hypoglycemia: yes  Checking glucose at home,   when she works nights she has hypoglycemia, missing meals,   She wants to keep her blood glucose 80-100     Current exercise: no regular exercise    Wt Readings from Last 3 Encounters:   06/23/16 196 lb 14.4 oz (89.3 kg)   03/10/16 215 lb 11.2 oz (97.8 kg)   11/05/15 197 lb 6.4 oz (89.5 kg)       BP Readings from Last 3 Encounters:   06/23/16 131/72   03/10/16 146/73   11/05/15 127/79           Past Medical History:   Diagnosis Date   ??? Kidney stones    ??? Type II diabetes mellitus, uncontrolled (HCC)      Current Outpatient Prescriptions   Medication Sig   ??? pravastatin (PRAVACHOL) 20 mg tablet Take 1 Tab by mouth nightly.   ??? glucose blood VI test strips (ONETOUCH VERIO) strip Test 3 times daily Dx Code: E11.65   ??? lancets (ONE TOUCH DELICA) 33 gauge misc Test 3 times daily Dx Code: E11.65   ??? TRULICITY 0.75 mg/0.5 mL sub-q pen INJECT 0.5ML SUBCUTANEOUSLY EVERY 7 DAYS   ??? insulin detemir (LEVEMIR FLEXTOUCH) 100 unit/mL (3 mL) inpn 60 Units by SubCUTAneous route daily.   ??? meclizine (ANTIVERT) 25 mg tablet Take 25 mg by mouth.   ???  NOVOLOG FLEXPEN 100 unit/mL inpn INJECT 15 UNITS SUBCUTANEOUSLY WITH MEALS   ??? oxybutynin chloride XL (DITROPAN XL) 5 mg CR tablet    ??? phenazopyridine (PYRIDIUM) 200 mg tablet Take  by mouth three (3) times daily as needed for Pain.   ??? linaclotide (LINZESS) 145 mcg cap capsule Take  by mouth Daily (before breakfast).   ??? metFORMIN (GLUCOPHAGE) 1,000 mg tablet Take 1 Tab by mouth two (2) times daily (with meals).     No current facility-administered medications for this visit.      Allergies   Allergen Reactions   ??? Iodinated Contrast- Oral And Iv Dye Anaphylaxis   ??? Morphine Anaphylaxis   ??? Pcn [Penicillins] Anaphylaxis   ??? Codeine Rash and Swelling   ??? Sulfa (Sulfonamide Antibiotics) Rash and Swelling     Throat closes         Review of Systems:    -  Constitutional Symptoms: no fevers  - Eyes: no blurry vision no double vision  - Gastrointestinal: no dysphagia ,  abdominal pain  - Musculoskeletal: Positive for joint pains no  weakness  - Integumentary: no rashes  - Neurological: no numbness, tingling,   - CVS - no chest pain  - RS - no sob  - Neurological: no numbness, tingling, or headaches  - Psychiatric: no depression or anxiety    -     Physical Examination:   Blood pressure 131/72, pulse 77, temperature 97.8 ??F (36.6 ??C), temperature source Oral, resp. rate 16, height 5\' 1"  (1.549 m), weight 196 lb 14.4 oz (89.3 kg), SpO2 99 %. Estimated body mass index is 37.2 kg/(m^2) as calculated from the following:    Height as of this encounter: 5\' 1"  (1.549 m).  -   Weight as of this encounter: 196 lb 14.4 oz (89.3 kg).  - General: pleasant, no distress, good eye contact  - HEENT: no pallor, no periorbital edema, EOMI  - Neck: supple, no thyromegaly, no nodules  - Cardiovascular: regular, normal rate, normal S1 and S2, no murmurs  - Respiratory: clear to auscultation bilaterally  - Gastrointestinal: soft, nontender, nondistended,  BS +  - Musculoskeletal: no proximal muscle weakness in upper or lower extremities  -    - Psychiatric: normal mood and affect  - Skin: color, texture, turgor normal.     Diabetic foot exam: January 2018    Left:     Vibratory sensation normal    Filament test normal sensation with micro filament   Pulse DP: 1+    Deformities: None  Right:    Vibratory sensation normal   Filament test normal sensation with micro filament   Pulse DP: 1+   Deformities: None      Data Reviewed:     []  Glucose records reviewed.  []  See glucose records for details (to be scanned).  []  A1C  []  Reviewed labs        Assessment/Plan:     1. Type 2 diabetes mellitus with hyperglycemia, with long-term current use of insulin (HCC)    2. Essential hypertension        1. Type 2 Diabetes Mellitus   Lab Results   Component Value Date/Time    Hemoglobin A1c 5.6 06/16/2016 08:29 AM    Hemoglobin A1c (POC) 6.4 11/05/2015 01:33 PM   Severe hypoglycemia  Levemir 50 units at bed time  Discontinue NovoLog   we discussed about not missing meals.  She wants A1c less than 5.5, discussed at length about complications of hypoglycemia, best option is to cut down the carbohydrates, lose 30 pounds and limit the amount of insulin  Metformin  Trulicity -  Advised to check glucose 2  times daily    FLU annually ,Pneumovax ,aspirin daily,annual eye exam,microalbumin      HTN - controlled     Mixed hyperlipidemia: Statin    4.Obesity:Body mass index is 37.2 kg/(m^2).  Discussed about the importance of exercise and carbohydrate portion control.    #5 hypoglycemia    Commended on the weight loss        Follow-up Disposition:  Return in about 3 months (around 09/23/2016).    Thank you for allowing me to participate in the care of this patient.    Dennison Mascot, MD      Patient verbalized understanding

## 2016-06-23 NOTE — Patient Instructions (Addendum)
Check blood sugars before meals/breakfast and at bedtime.    Low blood glucose is less than 70     Maintain the log and bring it all your appointments    If the bedtime sugars are less than 100 ,eat a 15 gm snack.    Levemir 50 units at bed time     Stop Novolog       If sugars are less than 70 more than 2 times give me a call             Please remember to bring your meter and/or log to every visit. Also, be sure to have a dilated diabetic eye exam done annually.     Refills -Please call your pharmacy and have them send us a refill request.  Results - Allow up to a week for lab results to be processed and reviewed.  Phone calls - Allow up to 24 hours for non-urgent calls to be returned.  Prior Authorization - May take up to 2 weeks to process depending on your insurance.  Forms - FMLA, DMV, Patient Assistance, etc. will take up to 2 weeks to process.   Cancellations - Please notify the office in advance if you cannot keep your appointment.  Samples - Will only be dispensed at visits as supplies are limited.     If you are having a medical emergency, call 911.

## 2016-06-23 NOTE — Progress Notes (Signed)
Christina Dillon is a 56 y.o. female here for   Chief Complaint   Patient presents with   ??? Diabetes       Functional glucose monitor and record keeping system? - yes  Eye exam within last year? - on file  Foot exam within last year? - on file    1. Have you been to the ER, urgent care clinic since your last visit?  Hospitalized since your last visit? -no    2. Have you seen or consulted any other health care providers outside of the [facility] since your last visit?  Include any pap smears or colon screening.-PCP      Lab Results   Component Value Date/Time    Hemoglobin A1c 5.6 06/16/2016 08:29 AM    Hemoglobin A1c (POC) 6.4 11/05/2015 01:33 PM       Wt Readings from Last 3 Encounters:   03/10/16 215 lb 11.2 oz (97.8 kg)   11/05/15 197 lb 6.4 oz (89.5 kg)   05/02/15 206 lb 14.4 oz (93.8 kg)     Temp Readings from Last 3 Encounters:   03/10/16 96.8 ??F (36 ??C) (Oral)   11/05/15 98.6 ??F (37 ??C) (Oral)   05/02/15 98.6 ??F (37 ??C) (Oral)     BP Readings from Last 3 Encounters:   03/10/16 146/73   11/05/15 127/79   05/02/15 128/77     Pulse Readings from Last 3 Encounters:   03/10/16 92   11/05/15 87   05/02/15 95

## 2016-07-03 ENCOUNTER — Encounter

## 2016-07-04 MED ORDER — LEVEMIR FLEXTOUCH U-100 INSULIN 100 UNIT/ML (3 ML) SUBCUTANEOUS PEN
100 unit/mL (3 mL) | SUBCUTANEOUS | 11 refills | Status: DC
Start: 2016-07-04 — End: 2016-09-29

## 2016-07-25 ENCOUNTER — Encounter

## 2016-07-26 MED ORDER — TRULICITY 0.75 MG/0.5 ML SUBCUTANEOUS PEN INJECTOR
0.75 mg/0.5 mL | INJECTION | SUBCUTANEOUS | 11 refills | Status: DC
Start: 2016-07-26 — End: 2016-09-29

## 2016-09-29 ENCOUNTER — Ambulatory Visit
Admit: 2016-09-29 | Discharge: 2016-09-29 | Payer: PRIVATE HEALTH INSURANCE | Attending: "Endocrinology | Primary: Family Medicine

## 2016-09-29 ENCOUNTER — Encounter

## 2016-09-29 DIAGNOSIS — E1165 Type 2 diabetes mellitus with hyperglycemia: Secondary | ICD-10-CM

## 2016-09-29 LAB — AMB POC GLUCOSE, QUANTITATIVE, BLOOD
Glucose POC: 47 mg/dL
Glucose POC: 95 mg/dL

## 2016-09-29 LAB — AMB POC HEMOGLOBIN A1C: Hemoglobin A1c (POC): 6.1 %

## 2016-09-29 MED ORDER — INSULIN DETEMIR 100 UNIT/ML (3 ML) SUB-Q PEN
100 unit/mL (3 mL) | SUBCUTANEOUS | 3 refills | Status: DC
Start: 2016-09-29 — End: 2017-07-21

## 2016-09-29 MED ORDER — DULAGLUTIDE 1.5 MG/0.5 ML SUBCUTANEOUS PEN INJECTOR
1.5 mg/0.5 mL | INJECTION | SUBCUTANEOUS | 3 refills | Status: DC
Start: 2016-09-29 — End: 2017-01-22

## 2016-09-29 NOTE — Progress Notes (Signed)
[facility] CARE DIABETES AND ENDOCRINOLOGY               Dennison Mascot ,MD        [address] [place] [address] [phone] Fax [phone]               Patient Information  Date:09/30/2016  Name : Christina Dillon 56 y.o.     D.O.B : Jul 21, 1960         Referred by: Milas Hock, DO       CC - DM     History of Present Illness: Christina Dillon is a 56 y.o. female here for fu  of  Type 2 Diabetes Mellitus.     Type 2 Diabetes was diagnosed in 2010 . End organ effects of diabetes: none.    Cardiovascular risk factors: dyslipidemia, diabetes mellitus, obesity, stress   Monitoring frequency:2 /day ,she was asked to decrease the insulin last visit but continued to take same dose   she is having hypoglycemia  No syncope  I have discussed with her multiple times if she has blood glucose less than 70 more than 2 times she has to call the office which she did not.   missing meals,   She wants to keep her blood glucose 80-100     Current exercise: no regular exercise    Wt Readings from Last 3 Encounters:   09/29/16 185 lb 8 oz (84.1 kg)   06/23/16 196 lb 14.4 oz (89.3 kg)   03/10/16 215 lb 11.2 oz (97.8 kg)       BP Readings from Last 3 Encounters:   09/29/16 114/63   06/23/16 131/72   03/10/16 146/73           Past Medical History:   Diagnosis Date   ??? Kidney stones    ??? Type II diabetes mellitus, uncontrolled (HCC)      Current Outpatient Prescriptions   Medication Sig   ??? metFORMIN (GLUCOPHAGE) 1,000 mg tablet Take 1 Tab by mouth two (2) times daily (with meals).   ??? pravastatin (PRAVACHOL) 20 mg tablet Take 1 Tab by mouth nightly.   ??? glucose blood VI test strips (ONETOUCH VERIO) strip Test 3 times daily Dx Code: E11.65   ??? lancets (ONE TOUCH DELICA) 33 gauge misc Test 3 times daily Dx Code: E11.65   ??? meclizine (ANTIVERT) 25 mg tablet Take 25 mg by mouth.   ??? oxybutynin chloride XL (DITROPAN XL) 5 mg CR tablet    ??? phenazopyridine (PYRIDIUM) 200 mg tablet Take  by mouth three (3) times daily as needed for  Pain.   ??? linaclotide (LINZESS) 145 mcg cap capsule Take  by mouth Daily (before breakfast).   ??? dulaglutide (TRULICITY) 1.5 mg/0.5 mL sub-q pen 0.5 mL by SubCUTAneous route every seven (7) days.   ??? insulin detemir U-100 (LEVEMIR FLEXTOUCH U-100 INSULN) 100 unit/mL (3 mL) inpn INJECT 40 UNITS SUBCUTANEOUSLY ONCE DAILY     No current facility-administered medications for this visit.      Allergies   Allergen Reactions   ??? Iodinated Contrast- Oral And Iv Dye Anaphylaxis   ??? Morphine Anaphylaxis   ??? Pcn [Penicillins] Anaphylaxis   ??? Codeine Rash and Swelling   ??? Sulfa (Sulfonamide Antibiotics) Rash and Swelling     Throat closes         Review of Systems:    - Constitutional Symptoms: no fevers  - Eyes: no blurry vision no double vision  -  Gastrointestinal: no dysphagia ,  abdominal pain  - Musculoskeletal: Positive for joint pains no  weakness  - Integumentary: no rashes  - Neurological: no numbness, tingling,   - CVS - no chest pain  - RS - no sob  - Neurological: no numbness, tingling, or headaches  - Psychiatric: no depression or anxiety    -     Physical Examination:   Blood pressure 114/63, pulse 92, temperature 97.4 ??F (36.3 ??C), temperature source Oral, resp. rate 16, height 5\' 1"  (1.549 m), weight 185 lb 8 oz (84.1 kg), SpO2 99 %. Estimated body mass index is 35.05 kg/(m^2) as calculated from the following:    Height as of this encounter: 5\' 1"  (1.549 m).  -   Weight as of this encounter: 185 lb 8 oz (84.1 kg).  - General: pleasant, no distress, good eye contact  - HEENT: no pallor, no periorbital edema, EOMI  - Neck: supple, no thyromegaly, no nodules  - Cardiovascular: regular, normal rate, normal S1 and S2, no murmurs  - Respiratory: clear to auscultation bilaterally  - Gastrointestinal: soft, nontender, nondistended,  BS +  - Musculoskeletal: no proximal muscle weakness in upper or lower extremities  -   - Psychiatric: normal mood and affect  - Skin: color, texture, turgor normal.     Diabetic foot exam:  January 2018    Left:     Vibratory sensation normal    Filament test normal sensation with micro filament   Pulse DP: 1+    Deformities: None  Right:    Vibratory sensation normal   Filament test normal sensation with micro filament   Pulse DP: 1+   Deformities: None      Data Reviewed:     []  Glucose records reviewed.  []  See glucose records for details (to be scanned).  []  A1C  []  Reviewed labs        Assessment/Plan:     1. Type 2 diabetes mellitus with hyperglycemia, with long-term current use of insulin (HCC)    2. Essential hypertension    3. Hypoglycemia    4. Non morbid obesity due to excess calories        1. Type 2 Diabetes Mellitus   Lab Results   Component Value Date/Time    Hemoglobin A1c 5.6 06/16/2016 08:29 AM    Hemoglobin A1c (POC) 6.1 09/29/2016 10:31 AM   Severe hypoglycemia  Levemir 40 units at bed time  Discontinued NovoLog   we discussed about not missing meals.  She wants A1c less than 5.5, discussed at length about complications of hypoglycemia, no advantage of keeping the A1c down when she has hypoglycemia  Goal is to stop the insulin eventually  Metformin  Trulicity -  Advised to check glucose 2  times daily    FLU annually ,Pneumovax ,aspirin daily,annual eye exam,microalbumin      HTN - controlled     Mixed hyperlipidemia: Statin    4.Obesity:Body mass index is 35.05 kg/(m^2).  Discussed about the importance of exercise and carbohydrate portion control.    #5 hypoglycemia: In the office she had hypoglycemia, blood glucose was 45  Corrected to 95            Follow-up Disposition:  Return in about 4 months (around 01/29/2017).    Thank you for allowing me to participate in the care of this patient.    Dennison MascotUma Luvina Poirier, MD      Patient verbalized understanding

## 2016-09-29 NOTE — Patient Instructions (Addendum)
Check blood sugars before meals/breakfast and at bedtime.    Low blood glucose is less than 70     Maintain the log and bring it all your appointments    If the bedtime sugars are less than 100 ,eat a 15 gm snack.    Levemir 40 units at bed time     Stop Novolog       If sugars are less than 70 more than 2 times give me a call     Download the meter next week             Please remember to bring your meter and/or log to every visit. Also, be sure to have a dilated diabetic eye exam done annually.     Refills -Please call your pharmacy and have them send us a refill request.  Results - Allow up to a week for lab results to be processed and reviewed.  Phone calls - Allow up to 24 hours for non-urgent calls to be returned.  Prior Authorization - May take up to 2 weeks to process depending on your insurance.  Forms - FMLA, DMV, Patient Assistance, etc. will take up to 2 weeks to process.   Cancellations - Please notify the office in advance if you cannot keep your appointment.  Samples - Will only be dispensed at visits as supplies are limited.     If you are having a medical emergency, call 911.

## 2016-09-29 NOTE — Progress Notes (Signed)
Christina Dillon is a 56 y.o. female here for   Chief Complaint   Patient presents with   ??? Diabetes       Functional glucose monitor and record keeping system? - yes  Eye exam within last year? - on file  Foot exam within last year? - on file    1. Have you been to the ER, urgent care clinic since your last visit?  Hospitalized since your last visit? -no    2. Have you seen or consulted any other health care providers outside of the [facility] since your last visit?  Include any pap smears or colon screening.-PCP

## 2016-12-14 ENCOUNTER — Encounter

## 2016-12-14 MED ORDER — LOVASTATIN 20 MG TAB
20 mg | ORAL_TABLET | Freq: Every evening | ORAL | 3 refills | Status: DC
Start: 2016-12-14 — End: 2017-09-04

## 2017-01-22 ENCOUNTER — Encounter

## 2017-01-23 MED ORDER — TRULICITY 1.5 MG/0.5 ML SUBCUTANEOUS PEN INJECTOR
1.5 mg/0.5 mL | INJECTION | SUBCUTANEOUS | 3 refills | Status: AC
Start: 2017-01-23 — End: ?

## 2017-02-10 ENCOUNTER — Encounter: Primary: Family Medicine

## 2017-02-17 ENCOUNTER — Encounter: Attending: "Endocrinology | Primary: Family Medicine

## 2017-06-15 ENCOUNTER — Encounter

## 2017-06-16 MED ORDER — BLOOD SUGAR DIAGNOSTIC TEST STRIPS
ORAL_STRIP | 11 refills | Status: DC
Start: 2017-06-16 — End: 2017-06-23

## 2017-06-23 ENCOUNTER — Encounter

## 2017-06-23 MED ORDER — LANCETS
3 refills | Status: AC
Start: 2017-06-23 — End: ?

## 2017-06-23 MED ORDER — BLOOD SUGAR DIAGNOSTIC TEST STRIPS
ORAL_STRIP | 3 refills | Status: AC
Start: 2017-06-23 — End: ?

## 2017-06-23 MED ORDER — BLOOD-GLUCOSE METER
0 refills | Status: AC
Start: 2017-06-23 — End: ?

## 2017-07-21 ENCOUNTER — Encounter

## 2017-07-21 MED ORDER — INSULIN DETEMIR 100 UNIT/ML (3 ML) SUB-Q PEN
100 unit/mL (3 mL) | SUBCUTANEOUS | 0 refills | Status: DC
Start: 2017-07-21 — End: 2017-08-20

## 2017-08-20 ENCOUNTER — Encounter

## 2017-08-21 MED ORDER — INSULIN DETEMIR 100 UNIT/ML (3 ML) SUB-Q PEN
100 unit/mL (3 mL) | SUBCUTANEOUS | 1 refills | Status: DC
Start: 2017-08-21 — End: 2018-01-06

## 2017-09-04 MED ORDER — PRAVASTATIN 20 MG TAB
20 mg | ORAL_TABLET | ORAL | 1 refills | Status: DC
Start: 2017-09-04 — End: 2018-03-07

## 2017-09-24 ENCOUNTER — Encounter

## 2017-12-23 ENCOUNTER — Encounter

## 2017-12-23 NOTE — Telephone Encounter (Signed)
Yr pt

## 2018-01-06 ENCOUNTER — Encounter

## 2018-01-06 MED ORDER — INSULIN DETEMIR 100 UNIT/ML (3 ML) SUB-Q PEN
100 unit/mL (3 mL) | SUBCUTANEOUS | 2 refills | Status: DC
Start: 2018-01-06 — End: 2018-03-07

## 2018-03-06 ENCOUNTER — Encounter

## 2018-03-06 NOTE — Telephone Encounter (Signed)
Per pharmacy, insurance does not cover levemir. Need alternative medication.

## 2018-03-07 MED ORDER — INSULIN GLARGINE 100 UNIT/ML (3 ML) SUB-Q PEN
100 unit/mL (3 mL) | SUBCUTANEOUS | 0 refills | Status: AC
Start: 2018-03-07 — End: ?

## 2018-03-07 MED ORDER — PRAVASTATIN 20 MG TAB
20 mg | ORAL_TABLET | ORAL | 0 refills | Status: AC
Start: 2018-03-07 — End: ?

## 2018-03-21 ENCOUNTER — Encounter

## 2018-03-21 MED ORDER — INSULIN GLARGINE 100 UNIT/ML (3 ML) SUB-Q PEN
100 unit/mL (3 mL) | SUBCUTANEOUS | 0 refills | Status: AC
Start: 2018-03-21 — End: ?

## 2018-04-28 ENCOUNTER — Telehealth

## 2018-04-28 NOTE — Telephone Encounter (Signed)
Order placed for pt,  per Verbal Order with read back from Dr Muthyala 04/28/2018

## 2018-05-01 ENCOUNTER — Encounter: Attending: Family Medicine | Primary: Family Medicine

## 2018-05-05 ENCOUNTER — Encounter: Primary: Family Medicine

## 2018-05-12 ENCOUNTER — Encounter: Attending: "Endocrinology | Primary: Family Medicine

## 2018-06-14 ENCOUNTER — Encounter

## 2018-10-04 ENCOUNTER — Encounter: Payer: Self-pay | Admitting: Family Medicine

## 2018-10-05 ENCOUNTER — Encounter: Payer: Self-pay | Admitting: *Deleted

## 2018-11-07 ENCOUNTER — Ambulatory Visit: Payer: BC Managed Care – PPO | Admitting: Gastroenterology

## 2018-11-07 ENCOUNTER — Other Ambulatory Visit: Payer: Self-pay

## 2018-11-07 ENCOUNTER — Encounter: Payer: Self-pay | Admitting: Gastroenterology

## 2018-11-07 ENCOUNTER — Encounter (INDEPENDENT_AMBULATORY_CARE_PROVIDER_SITE_OTHER): Payer: Self-pay

## 2018-11-07 VITALS — BP 120/75 | HR 99 | Temp 98.1°F | Wt 187.6 lb

## 2018-11-07 DIAGNOSIS — Z86010 Personal history of colonic polyps: Secondary | ICD-10-CM | POA: Diagnosis not present

## 2018-11-07 DIAGNOSIS — R1013 Epigastric pain: Secondary | ICD-10-CM

## 2018-11-07 DIAGNOSIS — Z800 Family history of malignant neoplasm of digestive organs: Secondary | ICD-10-CM | POA: Diagnosis not present

## 2018-11-07 DIAGNOSIS — K219 Gastro-esophageal reflux disease without esophagitis: Secondary | ICD-10-CM

## 2018-11-07 NOTE — Patient Instructions (Signed)
High-Fiber Diet Fiber, also called dietary fiber, is a type of carbohydrate that is found in fruits, vegetables, whole grains, and beans. A high-fiber diet can have many health benefits. Your health care provider may recommend a high-fiber diet to help:  Prevent constipation. Fiber can make your bowel movements more regular.  Lower your cholesterol.  Relieve the following conditions: ? Swelling of veins in the anus (hemorrhoids). ? Swelling and irritation (inflammation) of specific areas of the digestive tract (uncomplicated diverticulosis). ? A problem of the large intestine (colon) that sometimes causes pain and diarrhea (irritable bowel syndrome, IBS).  Prevent overeating as part of a weight-loss plan.  Prevent heart disease, type 2 diabetes, and certain cancers. What is my plan? The recommended daily fiber intake in grams (g) includes:  38 g for men age 50 or younger.  30 g for men over age 50.  25 g for women age 50 or younger.  21 g for women over age 50. You can get the recommended daily intake of dietary fiber by:  Eating a variety of fruits, vegetables, grains, and beans.  Taking a fiber supplement, if it is not possible to get enough fiber through your diet. What do I need to know about a high-fiber diet?  It is better to get fiber through food sources rather than from fiber supplements. There is not a lot of research about how effective supplements are.  Always check the fiber content on the nutrition facts label of any prepackaged food. Look for foods that contain 5 g of fiber or more per serving.  Talk with a diet and nutrition specialist (dietitian) if you have questions about specific foods that are recommended or not recommended for your medical condition, especially if those foods are not listed below.  Gradually increase how much fiber you consume. If you increase your intake of dietary fiber too quickly, you may have bloating, cramping, or gas.  Drink plenty  of water. Water helps you to digest fiber. What are tips for following this plan?  Eat a wide variety of high-fiber foods.  Make sure that half of the grains that you eat each day are whole grains.  Eat breads and cereals that are made with whole-grain flour instead of refined flour or white flour.  Eat brown rice, bulgur wheat, or millet instead of white rice.  Start the day with a breakfast that is high in fiber, such as a cereal that contains 5 g of fiber or more per serving.  Use beans in place of meat in soups, salads, and pasta dishes.  Eat high-fiber snacks, such as berries, raw vegetables, nuts, and popcorn.  Choose whole fruits and vegetables instead of processed forms like juice or sauce. What foods can I eat?  Fruits Berries. Pears. Apples. Oranges. Avocado. Prunes and raisins. Dried figs. Vegetables Sweet potatoes. Spinach. Kale. Artichokes. Cabbage. Broccoli. Cauliflower. Green peas. Carrots. Squash. Grains Whole-grain breads. Multigrain cereal. Oats and oatmeal. Brown rice. Barley. Bulgur wheat. Millet. Quinoa. Bran muffins. Popcorn. Rye wafer crackers. Meats and other proteins Navy, kidney, and pinto beans. Soybeans. Split peas. Lentils. Nuts and seeds. Dairy Fiber-fortified yogurt. Beverages Fiber-fortified soy milk. Fiber-fortified orange juice. Other foods Fiber bars. The items listed above may not be a complete list of recommended foods and beverages. Contact a dietitian for more options. What foods are not recommended? Fruits Fruit juice. Cooked, strained fruit. Vegetables Fried potatoes. Canned vegetables. Well-cooked vegetables. Grains White bread. Pasta made with refined flour. White rice. Meats and other   proteins Fatty cuts of meat. Fried chicken or fried fish. Dairy Milk. Yogurt. Cream cheese. Sour cream. Fats and oils Butters. Beverages Soft drinks. Other foods Cakes and pastries. The items listed above may not be a complete list of foods  and beverages to avoid. Contact a dietitian for more information. Summary  Fiber is a type of carbohydrate. It is found in fruits, vegetables, whole grains, and beans.  There are many health benefits of eating a high-fiber diet, such as preventing constipation, lowering blood cholesterol, helping with weight loss, and reducing your risk of heart disease, diabetes, and certain cancers.  Gradually increase your intake of fiber. Increasing too fast can result in cramping, bloating, and gas. Drink plenty of water while you increase your fiber.  The best sources of fiber include whole fruits and vegetables, whole grains, nuts, seeds, and beans. This information is not intended to replace advice given to you by your health care provider. Make sure you discuss any questions you have with your health care provider. Document Released: 02/08/2005 Document Revised: 12/13/2016 Document Reviewed: 12/13/2016 Elsevier Patient Education  2020 [employer] Food Choices for Gastroesophageal Reflux Disease, Adult When you have gastroesophageal reflux disease (GERD), the foods you eat and your eating habits are very important. Choosing the right foods can help ease your discomfort. Think about working with a nutrition specialist (dietitian) to help you make good choices. What are tips for following this plan?  Meals  Choose healthy foods that are low in fat, such as fruits, vegetables, whole grains, low-fat dairy products, and lean meat, fish, and poultry.  Eat small meals often instead of 3 large meals a day. Eat your meals slowly, and in a place where you are relaxed. Avoid bending over or lying down until 2-3 hours after eating.  Avoid eating meals 2-3 hours before bed.  Avoid drinking a lot of liquid with meals.  Cook foods using methods other than frying. Bake, grill, or broil food instead.  Avoid or limit: ? Chocolate. ? Peppermint or spearmint. ? Alcohol. ? Pepper. ? Black and decaffeinated  coffee. ? Black and decaffeinated tea. ? Bubbly (carbonated) soft drinks. ? Caffeinated energy drinks and soft drinks.  Limit high-fat foods such as: ? Fatty meat or fried foods. ? Whole milk, cream, butter, or ice cream. ? Nuts and nut butters. ? Pastries, donuts, and sweets made with butter or shortening.  Avoid foods that cause symptoms. These foods may be different for everyone. Common foods that cause symptoms include: ? Tomatoes. ? Oranges, lemons, and limes. ? Peppers. ? Spicy food. ? Onions and garlic. ? Vinegar. Lifestyle  Maintain a healthy weight. Ask your doctor what weight is healthy for you. If you need to lose weight, work with your doctor to do so safely.  Exercise for at least 30 minutes for 5 or more days each week, or as told by your doctor.  Wear loose-fitting clothes.  Do not smoke. If you need help quitting, ask your doctor.  Sleep with the head of your bed higher than your feet. Use a wedge under the mattress or blocks under the bed frame to raise the head of the bed. Summary  When you have gastroesophageal reflux disease (GERD), food and lifestyle choices are very important in easing your symptoms.  Eat small meals often instead of 3 large meals a day. Eat your meals slowly, and in a place where you are relaxed.  Limit high-fat foods such as fatty meat or fried foods.  Avoid  bending over or lying down until 2-3 hours after eating.  Avoid peppermint and spearmint, caffeine, alcohol, and chocolate. This information is not intended to replace advice given to you by your health care provider. Make sure you discuss any questions you have with your health care provider. Document Released: 08/10/2011 Document Revised: 06/01/2018 Document Reviewed: 03/16/2016 Elsevier Patient Education  2020 [employer].

## 2018-11-07 NOTE — Progress Notes (Signed)
Cephas Darby, MD [address]  [place]  [place], [place] [address]  Main: [phone]  Fax: [phone]    Gastroenterology Consultation  Referring Provider:     Bunnie Pion, FNP Primary Care Physician:  Bunnie Pion, FNP Primary Gastroenterologist:  Dr. Cephas Darby Reason for Consultation:     Chronic constipation, dyspepsia, GERD        HPI:   Deborah Avila is a 58 y.o. female referred by Dr. Sharlet Salina, [place] Cranker, [place]  for consultation & management of chronic constipation, dyspepsia, GERD  Chronic constipation: Patient has been experiencing several irregular bowel habits for last several months.  She moved from [place] to [place] to live with her mom and to take care of her dad.  Her dad passed away recently.  Patient has history of diabetes which became uncontrolled since she was divorced about 2 years ago and has been eating more due to stress.  She is currently on Linzess 290 MCG but takes 2-3 times a week when she is home.  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  She does report abdominal bloating and lower abdominal cramps.  She denies drinking sodas  Dyspepsia, GERD: Patient reports severe heartburn, regurgitation of food especially at night when she is working.  She takes omeprazole before she goes to work.  She also reports nausea and she was told that it could be secondary to her diabetes.  Her hemoglobin A1c is 11.1 on most recent labs.  She is currently on insulin.  Her blood sugars have been uncontrolled.  Her hemoglobin A1c was 5.6 in 2018  Copy of labs from her PCP from 09/29/2018 revealed normal hemoglobin, normal LFTs, normal BMP, HbA1c 11.1  Patient does not smoke or drink alcohol  NSAIDs: None  Antiplts/Anticoagulants/Anti thrombotics: None  GI Procedures:  Patient has family history of colon cancer in her mother when she was in 21s.  Her last colonoscopy when she was 58 year old and polyps were removed.  This was  done in [place]  History reviewed. No pertinent past medical history.  History reviewed. No pertinent surgical history.  Current Outpatient Medications:  .  Blood Glucose Monitoring Suppl (GLUCOCOM BLOOD GLUCOSE MONITOR) DEVI, Test 3 times daily Dx Code: E11.65, Disp: , Rfl:  .  busPIRone (BUSPAR) 5 MG tablet, Take 5 mg by mouth 2 (two) times daily., Disp: , Rfl:  .  cetirizine (ZYRTEC) 10 MG tablet, Take 10 mg by mouth daily., Disp: , Rfl:  .  glucose blood test strip, Test 3 times daily Dx Code: E11.65, Disp: , Rfl:  .  HUMALOG KWIKPEN 100 UNIT/ML KwikPen, INJECT 10 UNITS INTO THE SKIN 3 TIMES A DAY BEFORE MEALS, Disp: , Rfl:  .  Insulin Glargine (LANTUS SOLOSTAR) 100 UNIT/ML Solostar Pen, Inject 40 units at bed time NEEDS APPT FOR FURTHER REFILLS, Disp: , Rfl:  .  Lancets (ACCU-CHEK MULTICLIX) lancets, Test 3 times daily Dx Code: E11.65, Disp: , Rfl:  .  LINZESS 290 MCG CAPS capsule, , Disp: , Rfl:  .  lisinopril (ZESTRIL) 2.5 MG tablet, , Disp: , Rfl:  .  metFORMIN (GLUCOPHAGE) 1000 MG tablet, , Disp: , Rfl:  .  omeprazole (PRILOSEC) 20 MG capsule, Take by mouth daily., Disp: , Rfl:  .  oxybutynin (DITROPAN-XL) 5 MG 24 hr tablet, , Disp: , Rfl:  .  pravastatin (PRAVACHOL) 20 MG tablet, , Disp: , Rfl:  .  PROAIR HFA 108 (90 Base) MCG/ACT inhaler, ,  Disp: , Rfl:  .  SYMBICORT 160-4.5 MCG/ACT inhaler, , Disp: , Rfl:  .  Dulaglutide (TRULICITY) 1.5 MG/0.5ML SOPN, 0.5 ML BY SUBCUTANEOUS ROUTE EVERY SEVEN (7) DAYS., Disp: , Rfl:  .  meclizine (ANTIVERT) 25 MG tablet, Take by mouth., Disp: , Rfl:     History reviewed. No pertinent family history.   Social History   Tobacco Use  . Smoking status: Never Smoker  . Smokeless tobacco: Never Used  Substance Use Topics  . Alcohol use: Not on file  . Drug use: Not on file    Allergies as of 11/07/2018 - Review Complete 11/07/2018  Allergen Reaction Noted  . Iodinated diagnostic agents Anaphylaxis 02/28/2015  . Morphine Anaphylaxis  02/28/2015  . Penicillins Anaphylaxis 02/28/2015  . Codeine Rash and Swelling 02/28/2015  . Sulfa antibiotics Rash and Swelling 02/28/2015    Review of Systems:    All systems reviewed and negative except where noted in HPI.   Physical Exam:  BP 120/75   Pulse 99   Temp 98.1 F (36.7 C) (Oral)   Wt 187 lb 9.6 oz (85.1 kg)  No LMP recorded.  General:   Alert,  Well-developed, well-nourished, pleasant and cooperative in NAD Head:  Normocephalic and atraumatic. Eyes:  Sclera clear, no icterus.   Conjunctiva pink. Ears:  Normal auditory acuity. Nose:  No deformity, discharge, or lesions. Mouth:  No deformity or lesions,oropharynx pink & moist. Neck:  Supple; no masses or thyromegaly. Lungs:  Respirations even and unlabored.  Clear throughout to auscultation.   No wheezes, crackles, or rhonchi. No acute distress. Heart:  Regular rate and rhythm; no murmurs, clicks, rubs, or gallops. Abdomen:  Normal bowel sounds. Soft, non-tender and non-distended without masses, hepatosplenomegaly or hernias noted.  No guarding or rebound tenderness.   Rectal: Not performed Msk:  Symmetrical without gross deformities. Good, equal movement & strength bilaterally. Pulses:  Normal pulses noted. Extremities:  No clubbing or edema.  No cyanosis. Neurologic:  Alert and oriented x3;  grossly normal neurologically. Skin:  Intact without significant lesions or rashes. No jaundice. Lymph Nodes:  No significant cervical adenopathy. Psych:  Alert and cooperative. Normal mood and affect.  Imaging Studies: None  Assessment and Plan:   Deborah Avila is a 58 y.o. Caucasian female with metabolic syndrome, uncontrolled type II diabetes on insulin seen in consultation for chronic constipation, dyspepsia and chronic GERD.  Also has family history of colon cancer in first-degree relative  Chronic constipation: Discussed with her about high-fiber diet Continue Linzess 290 MCG daily Recommend to check TSH to rule  out hypothyroidism  Dyspepsia/GERD Continue omeprazole 40 mg before meals Discussed with her about possibility of diabetic gastroparesis given her diabetes is uncontrolled Discussed about antireflux lifestyle Recommend EGD for Barrett's screening  Personal history of colon polyps, family history colon cancer Recommend surveillance colonoscopy  Follow up in 6 weeks   Arlyss Repressohini R Vanga, MD

## 2018-11-08 ENCOUNTER — Other Ambulatory Visit: Payer: Self-pay

## 2018-11-08 DIAGNOSIS — R1013 Epigastric pain: Secondary | ICD-10-CM

## 2018-11-08 DIAGNOSIS — K219 Gastro-esophageal reflux disease without esophagitis: Secondary | ICD-10-CM

## 2018-11-08 DIAGNOSIS — Z86010 Personal history of colonic polyps: Secondary | ICD-10-CM

## 2018-11-08 DIAGNOSIS — Z800 Family history of malignant neoplasm of digestive organs: Secondary | ICD-10-CM

## 2018-11-13 ENCOUNTER — Telehealth: Payer: Self-pay | Admitting: Gastroenterology

## 2018-11-13 ENCOUNTER — Other Ambulatory Visit: Payer: Self-pay

## 2018-11-13 MED ORDER — SUPREP BOWEL PREP KIT 17.5-3.13-1.6 GM/177ML PO SOLN: 1.0000 | ORAL | 0 refills | Status: DC

## 2018-11-13 NOTE — Telephone Encounter (Signed)
Pt is calling she needs the medicine for her procedure called in to  [place] [phone] procedure date 11/17/18

## 2018-11-13 NOTE — Telephone Encounter (Signed)
Rx for suprep has been sent to pt's pharmacy.

## 2018-11-14 ENCOUNTER — Other Ambulatory Visit: Admission: RE | Admit: 2018-11-14 | Payer: BC Managed Care – PPO | Source: Ambulatory Visit

## 2018-11-16 ENCOUNTER — Telehealth: Payer: Self-pay

## 2018-11-16 NOTE — Telephone Encounter (Signed)
Contacted patient to inform her that Dr. Marius Ditch said we can do her colonoscopy any day next week as she is on call next week. Todays covid test results will be good for 7 days once we receive them. Patient insisted that she will wait and call her health nurse to see when she can expect her COVID test results from today to be back before being scheduled for next week.  Thanks [employer]

## 2018-11-20 ENCOUNTER — Other Ambulatory Visit: Admission: RE | Admit: 2018-11-20 | Payer: BC Managed Care – PPO | Source: Ambulatory Visit

## 2018-11-23 ENCOUNTER — Ambulatory Visit
Admission: RE | Admit: 2018-11-23 | Payer: BC Managed Care – PPO | Source: Ambulatory Visit | Admitting: Gastroenterology

## 2018-11-23 ENCOUNTER — Encounter: Admission: RE | Payer: Self-pay | Source: Ambulatory Visit

## 2018-11-23 SURGERY — COLONOSCOPY WITH PROPOFOL
Anesthesia: General

## 2018-12-26 ENCOUNTER — Ambulatory Visit: Payer: BC Managed Care – PPO | Admitting: Gastroenterology

## 2019-02-27 ENCOUNTER — Ambulatory Visit: Payer: BC Managed Care – PPO | Admitting: Gastroenterology

## 2019-04-23 ENCOUNTER — Other Ambulatory Visit: Payer: Self-pay | Admitting: Family Medicine

## 2019-04-23 DIAGNOSIS — Z1231 Encounter for screening mammogram for malignant neoplasm of breast: Secondary | ICD-10-CM

## 2019-05-22 ENCOUNTER — Ambulatory Visit
Admission: RE | Admit: 2019-05-22 | Discharge: 2019-05-22 | Disposition: A | Discharge status: Home / Self Care | Payer: BC Managed Care – PPO | Source: Ambulatory Visit | Attending: Family Medicine | Admitting: Family Medicine

## 2019-05-22 DIAGNOSIS — Z1231 Encounter for screening mammogram for malignant neoplasm of breast: Secondary | ICD-10-CM | POA: Insufficient documentation

## 2019-06-05 ENCOUNTER — Other Ambulatory Visit: Payer: Self-pay | Admitting: Family Medicine

## 2019-06-05 DIAGNOSIS — N6310 Unspecified lump in the right breast, unspecified quadrant: Secondary | ICD-10-CM

## 2019-06-05 DIAGNOSIS — R928 Other abnormal and inconclusive findings on diagnostic imaging of breast: Secondary | ICD-10-CM

## 2019-06-12 ENCOUNTER — Ambulatory Visit
Admission: RE | Admit: 2019-06-12 | Discharge: 2019-06-12 | Disposition: A | Discharge status: Home / Self Care | Payer: BC Managed Care – PPO | Source: Ambulatory Visit | Attending: Family Medicine | Admitting: Family Medicine

## 2019-06-12 DIAGNOSIS — N6310 Unspecified lump in the right breast, unspecified quadrant: Secondary | ICD-10-CM | POA: Diagnosis present

## 2019-06-12 DIAGNOSIS — R928 Other abnormal and inconclusive findings on diagnostic imaging of breast: Secondary | ICD-10-CM

## 2019-06-18 ENCOUNTER — Other Ambulatory Visit: Payer: Self-pay | Admitting: Family Medicine

## 2019-06-18 DIAGNOSIS — N6310 Unspecified lump in the right breast, unspecified quadrant: Secondary | ICD-10-CM

## 2019-06-18 DIAGNOSIS — R928 Other abnormal and inconclusive findings on diagnostic imaging of breast: Secondary | ICD-10-CM

## 2019-06-19 ENCOUNTER — Ambulatory Visit
Admission: RE | Admit: 2019-06-19 | Discharge: 2019-06-19 | Disposition: A | Discharge status: Home / Self Care | Payer: BC Managed Care – PPO | Source: Ambulatory Visit | Attending: Family Medicine | Admitting: Family Medicine

## 2019-06-19 DIAGNOSIS — N6310 Unspecified lump in the right breast, unspecified quadrant: Secondary | ICD-10-CM | POA: Diagnosis present

## 2019-06-19 DIAGNOSIS — R928 Other abnormal and inconclusive findings on diagnostic imaging of breast: Secondary | ICD-10-CM | POA: Diagnosis present

## 2019-06-19 HISTORY — PX: BREAST BIOPSY: SHX20

## 2019-06-21 LAB — SURGICAL PATHOLOGY

## 2019-06-28 ENCOUNTER — Other Ambulatory Visit
Admission: RE | Admit: 2019-06-28 | Discharge: 2019-06-28 | Disposition: A | Discharge status: Home / Self Care | Payer: BC Managed Care – PPO | Source: Ambulatory Visit | Attending: Internal Medicine | Admitting: Internal Medicine

## 2019-06-28 DIAGNOSIS — Z01812 Encounter for preprocedural laboratory examination: Secondary | ICD-10-CM | POA: Insufficient documentation

## 2019-06-28 DIAGNOSIS — Z20822 Contact with and (suspected) exposure to covid-19: Secondary | ICD-10-CM | POA: Diagnosis not present

## 2019-06-28 LAB — SARS CORONAVIRUS 2 (TAT 6-24 HRS): SARS Coronavirus 2: NEGATIVE

## 2019-06-29 ENCOUNTER — Encounter: Payer: Self-pay | Admitting: Internal Medicine

## 2019-07-02 ENCOUNTER — Encounter
Admission: RE | Disposition: A | Discharge status: Home / Self Care | Payer: Self-pay | Source: Home / Self Care | Attending: Internal Medicine

## 2019-07-02 ENCOUNTER — Ambulatory Visit: Payer: BC Managed Care – PPO | Admitting: Registered Nurse

## 2019-07-02 ENCOUNTER — Ambulatory Visit
Admission: RE | Admit: 2019-07-02 | Discharge: 2019-07-02 | Disposition: A | Discharge status: Home / Self Care | Payer: BC Managed Care – PPO | Attending: Internal Medicine | Admitting: Internal Medicine

## 2019-07-02 ENCOUNTER — Encounter: Payer: Self-pay | Admitting: Internal Medicine

## 2019-07-02 ENCOUNTER — Other Ambulatory Visit: Payer: Self-pay

## 2019-07-02 DIAGNOSIS — R1310 Dysphagia, unspecified: Secondary | ICD-10-CM | POA: Insufficient documentation

## 2019-07-02 DIAGNOSIS — Z800 Family history of malignant neoplasm of digestive organs: Secondary | ICD-10-CM | POA: Diagnosis not present

## 2019-07-02 DIAGNOSIS — Z6839 Body mass index (BMI) 39.0-39.9, adult: Secondary | ICD-10-CM | POA: Insufficient documentation

## 2019-07-02 DIAGNOSIS — Z79899 Other long term (current) drug therapy: Secondary | ICD-10-CM | POA: Diagnosis not present

## 2019-07-02 DIAGNOSIS — I10 Essential (primary) hypertension: Secondary | ICD-10-CM | POA: Insufficient documentation

## 2019-07-02 DIAGNOSIS — K2970 Gastritis, unspecified, without bleeding: Secondary | ICD-10-CM | POA: Insufficient documentation

## 2019-07-02 DIAGNOSIS — Z7951 Long term (current) use of inhaled steroids: Secondary | ICD-10-CM | POA: Diagnosis not present

## 2019-07-02 DIAGNOSIS — K219 Gastro-esophageal reflux disease without esophagitis: Secondary | ICD-10-CM | POA: Insufficient documentation

## 2019-07-02 DIAGNOSIS — Z86010 Personal history of colonic polyps: Secondary | ICD-10-CM | POA: Diagnosis not present

## 2019-07-02 DIAGNOSIS — Z794 Long term (current) use of insulin: Secondary | ICD-10-CM | POA: Insufficient documentation

## 2019-07-02 DIAGNOSIS — E119 Type 2 diabetes mellitus without complications: Secondary | ICD-10-CM | POA: Diagnosis not present

## 2019-07-02 DIAGNOSIS — Z1211 Encounter for screening for malignant neoplasm of colon: Secondary | ICD-10-CM | POA: Diagnosis not present

## 2019-07-02 HISTORY — PX: COLONOSCOPY WITH PROPOFOL: SHX5780

## 2019-07-02 HISTORY — PX: ESOPHAGOGASTRODUODENOSCOPY (EGD) WITH PROPOFOL: SHX5813

## 2019-07-02 HISTORY — DX: Gastro-esophageal reflux disease without esophagitis: K21.9

## 2019-07-02 HISTORY — DX: Type 2 diabetes mellitus without complications: E11.9

## 2019-07-02 LAB — GLUCOSE, CAPILLARY: Glucose-Capillary: 154 mg/dL — ABNORMAL HIGH (ref 70–99)

## 2019-07-02 SURGERY — COLONOSCOPY WITH PROPOFOL
Anesthesia: General

## 2019-07-02 MED ORDER — DEXMEDETOMIDINE HCL 200 MCG/2ML IV SOLN
INTRAVENOUS | Status: DC | PRN
  Administered 2019-07-02: 20 ug via INTRAVENOUS

## 2019-07-02 MED ORDER — IPRATROPIUM-ALBUTEROL 0.5-2.5 (3) MG/3ML IN SOLN
RESPIRATORY_TRACT | Status: AC
  Administered 2019-07-02: 3 mL
  Filled 2019-07-02: qty 3

## 2019-07-02 MED ORDER — PROPOFOL 500 MG/50ML IV EMUL
INTRAVENOUS | Status: AC
  Filled 2019-07-02: qty 150

## 2019-07-02 MED ORDER — LIDOCAINE HCL (PF) 2 % IJ SOLN
INTRAMUSCULAR | Status: AC
  Filled 2019-07-02: qty 40

## 2019-07-02 MED ORDER — PHENYLEPHRINE HCL (PRESSORS) 10 MG/ML IV SOLN
INTRAVENOUS | Status: AC
  Filled 2019-07-02: qty 1

## 2019-07-02 MED ORDER — PROPOFOL 10 MG/ML IV BOLUS
INTRAVENOUS | Status: DC | PRN
  Administered 2019-07-02: 40 mg via INTRAVENOUS
  Administered 2019-07-02: 20 mg via INTRAVENOUS
  Administered 2019-07-02: 80 mg via INTRAVENOUS

## 2019-07-02 MED ORDER — SODIUM CHLORIDE 0.9 % IV SOLN: INTRAVENOUS | Status: DC

## 2019-07-02 MED ORDER — LIDOCAINE HCL (CARDIAC) PF 100 MG/5ML IV SOSY
PREFILLED_SYRINGE | INTRAVENOUS | Status: DC | PRN
  Administered 2019-07-02: 100 mg via INTRAVENOUS

## 2019-07-02 MED ORDER — PROPOFOL 500 MG/50ML IV EMUL
INTRAVENOUS | Status: DC | PRN
  Administered 2019-07-02: 150 ug/kg/min via INTRAVENOUS

## 2019-07-02 MED ORDER — PHENYLEPHRINE HCL (PRESSORS) 10 MG/ML IV SOLN
INTRAVENOUS | Status: DC | PRN
  Administered 2019-07-02: 100 ug via INTRAVENOUS
  Administered 2019-07-02 (×2): 50 ug via INTRAVENOUS
  Administered 2019-07-02: 100 ug via INTRAVENOUS

## 2019-07-02 NOTE — Interval H&P Note (Signed)
History and Physical Interval Note:  07/02/2019 8:34 AM  Deborah Avila  has presented today for surgery, with the diagnosis of PH POLYPS DYSPHAGIA.  The various methods of treatment have been discussed with the patient and family. After consideration of risks, benefits and other options for treatment, the patient has consented to  Procedure(s): COLONOSCOPY WITH PROPOFOL (N/A) ESOPHAGOGASTRODUODENOSCOPY (EGD) WITH PROPOFOL (N/A) as a surgical intervention.  The patient's history has been reviewed, patient examined, no change in status, stable for surgery.  I have reviewed the patient's chart and labs.  Questions were answered to the patient's satisfaction.     [place], [place]

## 2019-07-02 NOTE — Op Note (Signed)
[facility] Gastroenterology Patient Name: Deborah Avila Procedure Date: 07/02/2019 8:03 AM MRN: [id number] Account #: [id number] Date of Birth: 1960/09/06 Admit Type: Outpatient Age: 59 Room: [facility] ENDO ROOM 3 Gender: Female Note Status: Finalized Procedure:             Colonoscopy Indications:           Screening in patient at increased risk: Family history                         of 1st-degree relative with colorectal cancer, High                         risk colon cancer surveillance: Personal history of                         colonic polyps Providers:             Benay Pike. [name] MD, MD Referring MD:          No Local Md, MD (Referring MD) Medicines:             Propofol per Anesthesia Complications:         No immediate complications. Procedure:             Pre-Anesthesia Assessment:                        - The risks and benefits of the procedure and the                         sedation options and risks were discussed with the                         patient. All questions were answered and informed                         consent was obtained.                        - Patient identification and proposed procedure were                         verified prior to the procedure by the nurse. The                         procedure was verified in the procedure room.                        - ASA Grade Assessment: III - A patient with severe                         systemic disease.                        - After reviewing the risks and benefits, the patient                         was deemed in satisfactory condition to undergo the  procedure.                        After obtaining informed consent, the colonoscope was                         passed under direct vision. Throughout the procedure,                         the patient's blood pressure, pulse, and oxygen                         saturations were monitored continuously. The                       Colonoscope was introduced through the anus and                         advanced to the the terminal ileum, with                         identification of the appendiceal orifice and IC                         valve. The colonoscopy was somewhat difficult due to                         restricted mobility of the colon. Successful                         completion of the procedure was aided by withdrawing                         and reinserting the scope. The patient tolerated the                         procedure well. The quality of the bowel preparation                         was excellent. Findings:      The perianal and digital rectal examinations were normal. Pertinent       negatives include normal sphincter tone and no palpable rectal lesions.      The entire examined colon appeared normal on direct and retroflexion       views.      The terminal ileum appeared normal. Impression:            - The entire examined colon is normal on direct and                         retroflexion views.                        - No specimens collected. Recommendation:        - Await pathology results from EGD, also performed                         today.                        -  Patient has a contact number available for                         emergencies. The signs and symptoms of potential                         delayed complications were discussed with the patient.                         Return to normal activities tomorrow. Written                         discharge instructions were provided to the patient.                        - Resume previous diet.                        - Continue present medications.                        - Repeat colonoscopy in 5 years for screening purposes.                        - Return to GI office PRN.                        - The findings and recommendations were discussed with                         the patient. Procedure Code(s):      --- Professional ---                        N2355, Colorectal cancer screening; colonoscopy on                         individual at high risk Diagnosis Code(s):     --- Professional ---                        Z86.010, Personal history of colonic polyps                        Z80.0, Family history of malignant neoplasm of                         digestive organs CPT copyright 2019 American Medical Association. All rights reserved. The codes documented in this report are preliminary and upon coder review may  be revised to meet current compliance requirements. Stanton Kidney MD, MD 07/02/2019 9:03:43 AM This report has been signed electronically. Number of Addenda: 0 Note Initiated On: 07/02/2019 8:03 AM Scope Withdrawal Time: 0 hours 6 minutes 18 seconds  Total Procedure Duration: 0 hours 11 minutes 33 seconds  Estimated Blood Loss:  Estimated blood loss: none. Estimated blood loss: none.      [facility]

## 2019-07-02 NOTE — Transfer of Care (Signed)
Immediate Anesthesia Transfer of Care Note  Patient: Deborah Avila  Procedure(s) Performed: COLONOSCOPY WITH PROPOFOL (N/A ) ESOPHAGOGASTRODUODENOSCOPY (EGD) WITH PROPOFOL (N/A )  Patient Location: Endoscopy Unit  Anesthesia Type:General  Level of Consciousness: drowsy  Airway & Oxygen Therapy: Patient Spontanous Breathing  Post-op Assessment: Report given to RN and Post -op Vital signs reviewed and stable  Post vital signs: Reviewed and stable  Last Vitals:  Vitals Value Taken Time  BP 100/55 07/02/19 0903  Temp 35.6 C 07/02/19 0902  Pulse 79 07/02/19 0903  Resp 18 07/02/19 0903  SpO2 98 % 07/02/19 0903  Vitals shown include unvalidated device data.  Last Pain:  Vitals:   07/02/19 0902  TempSrc: Tympanic  PainSc: 0-No pain         Complications: No apparent anesthesia complications

## 2019-07-02 NOTE — Op Note (Signed)
[facility] Gastroenterology Patient Name: Deborah Avila Procedure Date: 07/02/2019 8:04 AM MRN: [id number] Account #: [id number] Date of Birth: 10/03/1960 Admit Type: Outpatient Age: 59 Room: [facility] ENDO ROOM 3 Gender: Female Note Status: Finalized Procedure:             Upper GI endoscopy Indications:           Dysphagia, Follow-up of gastro-esophageal reflux                         disease Providers:             Benay Pike. [name] MD, MD Referring MD:          No Local Md, MD (Referring MD) Medicines:             Propofol per Anesthesia Complications:         No immediate complications. Procedure:             Pre-Anesthesia Assessment:                        - The risks and benefits of the procedure and the                         sedation options and risks were discussed with the                         patient. All questions were answered and informed                         consent was obtained.                        - Patient identification and proposed procedure were                         verified prior to the procedure by the nurse. The                         procedure was verified in the procedure room.                        - ASA Grade Assessment: III - A patient with severe                         systemic disease.                        - After reviewing the risks and benefits, the patient                         was deemed in satisfactory condition to undergo the                         procedure.                        After obtaining informed consent, the endoscope was                         passed under direct vision. Throughout the procedure,  the patient's blood pressure, pulse, and oxygen                         saturations were monitored continuously. The Endoscope                         was introduced through the mouth, and advanced to the                         third part of duodenum. The upper GI endoscopy was                          accomplished without difficulty. The patient tolerated                         the procedure well. Findings:      Two tongues of salmon-colored mucosa were present from 36 to 38 cm. No       other visible abnormalities were present. The maximum longitudinal       extent of these esophageal mucosal changes was 2 cm in length. Mucosa       was biopsied with a cold forceps for histology. One specimen bottle was       sent to pathology.      No endoscopic abnormality was evident in the esophagus to explain the       patient's complaint of dysphagia. The scope was withdrawn. Dilation was       performed with a Maloney dilator with no resistance at 54 Fr.      Patchy minimal inflammation characterized by erythema was found in the       gastric antrum.      The cardia and gastric fundus were normal on retroflexion.      The examined duodenum was normal.      The exam was otherwise without abnormality. Impression:            - Salmon-colored mucosa suspicious for short-segment                         Barrett's esophagus. Biopsied.                        - No endoscopic esophageal abnormality to explain                         patient's dysphagia. Dilated.                        - Gastritis.                        - Normal examined duodenum.                        - The examination was otherwise normal. Recommendation:        - Patient has a contact number available for                         emergencies. The signs and symptoms of potential  delayed complications were discussed with the patient.                         Return to normal activities tomorrow. Written                         discharge instructions were provided to the patient.                        - Resume previous diet.                        - Continue present medications.                        - Await pathology results.                        - Proceed with  colonoscopy Procedure Code(s):     --- Professional ---                        [id number], Esophagogastroduodenoscopy, flexible,                         transoral; with biopsy, single or multiple                        43450, Dilation of esophagus, by unguided sound or                         bougie, single or multiple passes Diagnosis Code(s):     --- Professional ---                        K21.9, Gastro-esophageal reflux disease without                         esophagitis                        K29.70, Gastritis, unspecified, without bleeding                        R13.10, Dysphagia, unspecified                        K22.8, Other specified diseases of esophagus CPT copyright 2019 American Medical Association. All rights reserved. The codes documented in this report are preliminary and upon coder review may  be revised to meet current compliance requirements. Stanton Kidney MD, MD 07/02/2019 8:45:30 AM This report has been signed electronically. Number of Addenda: 0 Note Initiated On: 07/02/2019 8:04 AM Estimated Blood Loss:  Estimated blood loss: none.      [facility]

## 2019-07-02 NOTE — H&P (Signed)
Outpatient short stay form Pre-procedure 07/02/2019 8:31 AM Deborah Avila K. Deborah Avila, M.D.  Primary Physician: Hendricks Milo, FNP  Reason for visit:  Dysphagia, GERD, Family hx oof colon cancer, personal hx of colon polyps.   History of present illness:  59 y/o female with hx of GERD and presumed esophageal stricture s/p remote EGD with esophageal dilation several years ago in [place], presents with recurrent dysphagia to solids at the level of the upper sternum. GERD symptoms controlled with PPI.  59 year old patient presenting for family history of colon cancer. Patient denies any change in bowel habits, rectal bleeding or involuntary weight loss.                           Patient presents for colonoscopy for a personal hx of colon polyps. The patient denies abdominal pain, abnormal weight loss or rectal bleeding.       Current Facility-Administered Medications:  .  0.9 %  sodium chloride infusion, , Intravenous, Continuous, Deborah Avila, Deborah Pike, MD, Last Rate: 20 mL/hr at 07/02/19 0813, New Bag at 07/02/19 0813  Medications Prior to Admission  Medication Sig Dispense Refill Last Dose  . Blood Glucose Monitoring Suppl (GLUCOCOM BLOOD GLUCOSE MONITOR) DEVI Test 3 times daily Dx Code: E11.65   07/02/2019 at 700  . busPIRone (BUSPAR) 5 MG tablet Take 5 mg by mouth 2 (two) times daily.   07/01/2019 at 1100  . cetirizine (ZYRTEC) 10 MG tablet Take 10 mg by mouth daily.   07/01/2019 at 1100  . Dulaglutide (TRULICITY) 1.5 NW/2.9FA SOPN 0.5 ML BY SUBCUTANEOUS ROUTE EVERY SEVEN (7) DAYS.   07/01/2019 at 1800  . glucose blood test strip Test 3 times daily Dx Code: E11.65   07/02/2019 at 0700  . HUMALOG KWIKPEN 100 UNIT/ML KwikPen INJECT 10 UNITS INTO THE SKIN 3 TIMES A DAY BEFORE MEALS   07/01/2019 at 1800  . Insulin Glargine (LANTUS SOLOSTAR) 100 UNIT/ML Solostar Pen Inject 40 units at bed time NEEDS APPT FOR FURTHER REFILLS   07/01/2019 at 1800  . Lancets (ACCU-CHEK MULTICLIX) lancets Test 3 times daily Dx Code:  E11.65   07/02/2019 at 0700  . LINZESS 290 MCG CAPS capsule    Past Week at Unknown time  . lisinopril (ZESTRIL) 2.5 MG tablet    07/01/2019 at 1100  . meclizine (ANTIVERT) 25 MG tablet Take by mouth.   07/01/2019 at 1100  . metFORMIN (GLUCOPHAGE) 1000 MG tablet    07/01/2019 at 2300  . omeprazole (PRILOSEC) 20 MG capsule Take by mouth daily.   07/01/2019 at 1100  . oxybutynin (DITROPAN-XL) 5 MG 24 hr tablet    07/01/2019 at 1100  . pravastatin (PRAVACHOL) 20 MG tablet    07/01/2019 at 1100  . PROAIR HFA 108 (90 Base) MCG/ACT inhaler    Past Month at Unknown time  . SYMBICORT 160-4.5 MCG/ACT inhaler    Past Month at Unknown time  . Na Sulfate-K Sulfate-Mg Sulf (SUPREP BOWEL PREP KIT) 17.5-3.13-1.6 GM/177ML SOLN Take 1 kit by mouth as directed. (Patient not taking: Reported on 07/02/2019) 354 mL 0 Completed Course at Unknown time     Allergies  Allergen Reactions  . Iodinated Diagnostic Agents Anaphylaxis  . Morphine Anaphylaxis  . Penicillins Anaphylaxis  . Shellfish Allergy Anaphylaxis  . Codeine Rash and Swelling  . Sulfa Antibiotics Rash and Swelling    Throat closes     Past Medical History:  Diagnosis Date  . Diabetes mellitus without  complication ([place])   . GERD (gastroesophageal reflux disease)     Review of systems:  Otherwise negative.    Physical Exam  Gen: Alert, oriented. Appears stated age.  HEENT: [place]/AT. PERRLA. Lungs: CTA, no wheezes. CV: RR nl S1, S2. Abd: soft, benign, no masses. BS+ Ext: No edema. Pulses 2+    Planned procedures: Proceed with EGD and colonoscopy. The patient understands the nature of the planned procedure, indications, risks, alternatives and potential complications including but not limited to bleeding, infection, perforation, damage to internal organs and possible oversedation/side effects from anesthesia. The patient agrees and gives consent to proceed.  Please refer to procedure notes for findings, recommendations and patient  disposition/instructions.     [name] K. Deborah Avila, M.D. Gastroenterology 07/02/2019  8:31 AM

## 2019-07-02 NOTE — Anesthesia Postprocedure Evaluation (Signed)
Anesthesia Post Note  Patient: Deborah Avila  Procedure(s) Performed: COLONOSCOPY WITH PROPOFOL (N/A ) ESOPHAGOGASTRODUODENOSCOPY (EGD) WITH PROPOFOL (N/A )  Patient location during evaluation: Endoscopy Anesthesia Type: General Level of consciousness: awake and alert Pain management: pain level controlled Vital Signs Assessment: post-procedure vital signs reviewed and stable Respiratory status: spontaneous breathing and respiratory function stable Cardiovascular status: stable Anesthetic complications: no     Last Vitals:  Vitals:   07/02/19 0902 07/02/19 0912  BP: (!) 100/55 (!) 86/56  Pulse: 79 84  Resp: 18 18  Temp: (!) 35.6 C   SpO2: 98% 98%    Last Pain:  Vitals:   07/02/19 0912  TempSrc:   PainSc: 0-No pain                 [name]

## 2019-07-02 NOTE — Anesthesia Preprocedure Evaluation (Signed)
Anesthesia Evaluation  Patient identified by MRN, date of birth, ID band Patient awake    Reviewed: Allergy & Precautions, NPO status , Patient's Chart, lab work & pertinent test results  History of Anesthesia Complications Negative for: history of anesthetic complications  Airway Mallampati: II       Dental   Pulmonary asthma , neg sleep apnea, neg COPD, Not current smoker,           Cardiovascular hypertension, Pt. on medications (-) Past MI and (-) CHF (-) dysrhythmias (-) Valvular Problems/Murmurs     Neuro/Psych neg Seizures    GI/Hepatic Neg liver ROS, GERD  Medicated and Controlled,  Endo/Other  diabetes, Type 2, Oral Hypoglycemic Agents, Insulin Dependent  Renal/GU negative Renal ROS     Musculoskeletal   Abdominal   Peds  Hematology   Anesthesia Other Findings   Reproductive/Obstetrics                             Anesthesia Physical Anesthesia Plan  ASA: III  Anesthesia Plan: General   Post-op Pain Management:    Induction: Intravenous  PONV Risk Score and Plan: 3 and Propofol infusion, TIVA and Treatment may vary due to age or medical condition  Airway Management Planned: Nasal Cannula  Additional Equipment:   Intra-op Plan:   Post-operative Plan:   Informed Consent: I have reviewed the patients History and Physical, chart, labs and discussed the procedure including the risks, benefits and alternatives for the proposed anesthesia with the patient or authorized representative who has indicated his/her understanding and acceptance.       Plan Discussed with:   Anesthesia Plan Comments:         Anesthesia Quick Evaluation

## 2019-07-03 ENCOUNTER — Encounter: Payer: Self-pay | Admitting: *Deleted

## 2019-07-03 LAB — SURGICAL PATHOLOGY

## 2020-05-30 ENCOUNTER — Other Ambulatory Visit: Payer: Self-pay | Admitting: Family Medicine

## 2020-07-12 ENCOUNTER — Encounter: Payer: Self-pay | Admitting: Emergency Medicine

## 2020-07-12 ENCOUNTER — Ambulatory Visit
Admission: EM | Admit: 2020-07-12 | Discharge: 2020-07-12 | Disposition: A | Discharge status: Home / Self Care | Payer: BC Managed Care – PPO | Attending: Emergency Medicine | Admitting: Emergency Medicine

## 2020-07-12 ENCOUNTER — Other Ambulatory Visit: Payer: Self-pay

## 2020-07-12 DIAGNOSIS — L237 Allergic contact dermatitis due to plants, except food: Secondary | ICD-10-CM

## 2020-07-12 DIAGNOSIS — L299 Pruritus, unspecified: Secondary | ICD-10-CM

## 2020-07-12 MED ORDER — DEXAMETHASONE SODIUM PHOSPHATE 10 MG/ML IJ SOLN
10.0000 mg | Freq: Once | INTRAMUSCULAR | Status: AC
  Administered 2020-07-12: 10 mg via INTRAMUSCULAR

## 2020-07-12 NOTE — ED Triage Notes (Signed)
Poison ivy for over 1 week.  Has used benadryl and calamine.

## 2020-07-12 NOTE — Discharge Instructions (Signed)
Wash with warm water and mild soap Steroid shot given in office Prednisone prescribed.  Take as directed and to completion Prescribed hydroxyzine for itching.  Do not take prior to driving or operating heavy machinery.  This medication may make you drowsy Follow up with PCP if symptoms persists  Return or go to the ED if you have any new or worsening symptoms such as fever, chills, nausea, vomiting, difficulty breathing, throat swelling, tongue swelling, numbness/ tingling in mouth, worsening symptoms despite treatment, etc..Marland Kitchen

## 2020-07-12 NOTE — ED Provider Notes (Signed)
[place]   [id number] 07/12/20 Arrival Time: 1223  CC: rash   SUBJECTIVE:  Deborah Avila is a 60 y.o. female who presents with a itchy, red, and spreading poison ivy/ oak rash to body including limbs, torso and face x 1 week. Has tried OTC medications without relief.  Symptoms are made worse with scratching.  Reports previous symptoms, but not this severe.  Denies fever, chills, nausea, vomiting, discharge, SOB, chest pain, abdominal pain, changes in bowel or bladder function.    ROS: As per HPI.  All other pertinent ROS negative.     Past Medical History:  Diagnosis Date  . Diabetes mellitus without complication ([place])   . GERD (gastroesophageal reflux disease)    Past Surgical History:  Procedure Laterality Date  . APPENDECTOMY    . BREAST BIOPSY Right 06/19/2019   [place] core coil clip   . CHOLECYSTECTOMY    . COLONOSCOPY WITH ESOPHAGOGASTRODUODENOSCOPY (EGD)    . COLONOSCOPY WITH PROPOFOL N/A 07/02/2019   Procedure: COLONOSCOPY WITH PROPOFOL;  Surgeon: Toledo, Benay Pike, MD;  Location: [facility] ENDOSCOPY;  Service: Gastroenterology;  Laterality: N/A;  . ESOPHAGOGASTRODUODENOSCOPY (EGD) WITH PROPOFOL N/A 07/02/2019   Procedure: ESOPHAGOGASTRODUODENOSCOPY (EGD) WITH PROPOFOL;  Surgeon: Toledo, Benay Pike, MD;  Location: [facility] ENDOSCOPY;  Service: Gastroenterology;  Laterality: N/A;  . TONSILLECTOMY     Allergies  Allergen Reactions  . Iodinated Diagnostic Agents Anaphylaxis  . Morphine Anaphylaxis  . Penicillins Anaphylaxis  . Shellfish Allergy Anaphylaxis  . Codeine Rash and Swelling  . Sulfa Antibiotics Rash and Swelling    Throat closes   No current facility-administered medications on file prior to encounter.   Current Outpatient Medications on File Prior to Encounter  Medication Sig Dispense Refill  . Blood Glucose Monitoring Suppl (GLUCOCOM BLOOD GLUCOSE MONITOR) DEVI Test 3 times daily Dx Code: E11.65    . busPIRone (BUSPAR) 5 MG tablet Take 5 mg by mouth 2  (two) times daily.    . cetirizine (ZYRTEC) 10 MG tablet Take 10 mg by mouth daily.    . Dulaglutide (TRULICITY) 1.5 WU/9.8JX SOPN 0.5 ML BY SUBCUTANEOUS ROUTE EVERY SEVEN (7) DAYS.    Marland Kitchen glucose blood test strip Test 3 times daily Dx Code: E11.65    . HUMALOG KWIKPEN 100 UNIT/ML KwikPen INJECT 10 UNITS INTO THE SKIN 3 TIMES A DAY BEFORE MEALS    . Insulin Glargine (LANTUS SOLOSTAR) 100 UNIT/ML Solostar Pen Inject 40 units at bed time NEEDS APPT FOR FURTHER REFILLS    . Lancets (ACCU-CHEK MULTICLIX) lancets Test 3 times daily Dx Code: E11.65    . LINZESS 290 MCG CAPS capsule     . lisinopril (ZESTRIL) 2.5 MG tablet     . meclizine (ANTIVERT) 25 MG tablet Take by mouth.    . metFORMIN (GLUCOPHAGE) 1000 MG tablet     . Na Sulfate-K Sulfate-Mg Sulf (SUPREP BOWEL PREP KIT) 17.5-3.13-1.6 GM/177ML SOLN Take 1 kit by mouth as directed. (Patient not taking: Reported on 07/02/2019) 354 mL 0  . omeprazole (PRILOSEC) 20 MG capsule Take by mouth daily.    Marland Kitchen oxybutynin (DITROPAN-XL) 5 MG 24 hr tablet     . pravastatin (PRAVACHOL) 20 MG tablet     . PROAIR HFA 108 (90 Base) MCG/ACT inhaler     . SYMBICORT 160-4.5 MCG/ACT inhaler      Social History   Socioeconomic History  . Marital status: Divorced    Spouse name: Not on file  . Number of children: Not on file  .  Years of education: Not on file  . Highest education level: Not on file  Occupational History  . Not on file  Tobacco Use  . Smoking status: Never Smoker  . Smokeless tobacco: Never Used  Vaping Use  . Vaping Use: Never used  Substance and Sexual Activity  . Alcohol use: Never  . Drug use: Never  . Sexual activity: Not on file  Other Topics Concern  . Not on file  Social History Narrative  . Not on file   Social Determinants of Health   Financial Resource Strain: Not on file  Food Insecurity: Not on file  Transportation Needs: Not on file  Physical Activity: Not on file  Stress: Not on file  Social Connections: Not on file   Intimate Partner Violence: Not on file   No family history on file.  OBJECTIVE: Vitals:   07/12/20 1342 07/12/20 1343  BP: (!) 165/89   Pulse: 98   Resp: 18   Temp: 98.3 F (36.8 C)   TempSrc: Oral   SpO2: 96%   Weight:  180 lb (81.6 kg)    General appearance: alert; no distress Head: NCAT Lungs: normal respiratory effort Extremities: no edema Skin: warm and dry; areas of linear papules and vesicles with surrounding erythema to limbs, torso, and face Psychological: alert and cooperative; normal mood and affect  ASSESSMENT & PLAN:  1. Poison ivy dermatitis   2. Itching     Meds ordered this encounter  Medications  . dexamethasone (DECADRON) injection 10 mg    Wash with warm water and mild soap Steroid shot given in office Prednisone prescribed.  Take as directed and to completion Prescribed hydroxyzine for itching.  Do not take prior to driving or operating heavy machinery.  This medication may make you drowsy Follow up with PCP if symptoms persists  Return or go to the ED if you have any new or worsening symptoms such as fever, chills, nausea, vomiting, difficulty breathing, throat swelling, tongue swelling, numbness/ tingling in mouth, worsening symptoms despite treatment, etc...   Reviewed expectations re: course of current medical issues. Questions answered. Outlined signs and symptoms indicating need for more acute intervention. Patient verbalized understanding. After Visit Summary given.   Lestine Box, PA-C 07/12/20 1405

## 2020-07-14 ENCOUNTER — Telehealth: Payer: Self-pay | Admitting: Family Medicine

## 2020-07-14 DIAGNOSIS — L237 Allergic contact dermatitis due to plants, except food: Secondary | ICD-10-CM

## 2020-07-14 DIAGNOSIS — L299 Pruritus, unspecified: Secondary | ICD-10-CM

## 2020-07-14 MED ORDER — PREDNISONE 10 MG (21) PO TBPK: ORAL_TABLET | Freq: Every day | ORAL | 0 refills | Status: AC

## 2020-07-14 MED ORDER — HYDROXYZINE HCL 25 MG PO TABS: 25.0000 mg | ORAL_TABLET | Freq: Four times a day (QID) | ORAL | 0 refills | Status: DC

## 2020-07-14 NOTE — Telephone Encounter (Signed)
Medications re sent to pharmacy. They did not go through the first time.

## 2020-10-06 ENCOUNTER — Other Ambulatory Visit: Payer: Self-pay

## 2020-10-06 ENCOUNTER — Ambulatory Visit: Payer: BC Managed Care – PPO | Admitting: Dermatology

## 2020-10-06 DIAGNOSIS — L820 Inflamed seborrheic keratosis: Secondary | ICD-10-CM

## 2020-10-06 DIAGNOSIS — L821 Other seborrheic keratosis: Secondary | ICD-10-CM

## 2020-10-06 DIAGNOSIS — L720 Epidermal cyst: Secondary | ICD-10-CM | POA: Diagnosis not present

## 2020-10-06 DIAGNOSIS — L578 Other skin changes due to chronic exposure to nonionizing radiation: Secondary | ICD-10-CM | POA: Diagnosis not present

## 2020-10-06 DIAGNOSIS — L918 Other hypertrophic disorders of the skin: Secondary | ICD-10-CM

## 2020-10-06 NOTE — Patient Instructions (Addendum)
Start small amount of Arazalo to affected areas at face at bedtime.   Cryotherapy Aftercare  Wash gently with soap and water everyday.   Apply Vaseline and Band-Aid daily until healed.   Recommend daily broad spectrum sunscreen SPF 30+ to sun-exposed areas, reapply every 2 hours as needed. Call for new or changing lesions.  Staying in the shade or wearing long sleeves, sun glasses (UVA+UVB protection) and wide brim hats (4-inch brim around the entire circumference of the hat) are also recommended for sun protection.   If you have any questions or concerns for your doctor, please call our main line at [phone] and press option 4 to reach your doctor's medical assistant. If no one answers, please leave a voicemail as directed and we will return your call as soon as possible. Messages left after 4 pm will be answered the following business day.   You may also send [place] a message via MyChart. We typically respond to MyChart messages within 1-2 business days.  For prescription refills, please ask your pharmacy to contact our office. Our fax number is [phone].  If you have an urgent issue when the clinic is closed that cannot wait until the next business day, you can page your doctor at the number below.    Please note that while we do our best to be available for urgent issues outside of office hours, we are not available 24/7.   If you have an urgent issue and are unable to reach [place], you may choose to seek medical care at your doctor's office, retail clinic, urgent care center, or emergency room.  If you have a medical emergency, please immediately call 911 or go to the emergency department.  Pager Numbers  - Dr. Gwen Pounds: [phone]  - Dr. Neale Burly: [phone]  - Dr. Roseanne Reno: [phone]  In the event of inclement weather, please call our main line at [phone] for an update on the status of any delays or closures.  Dermatology Medication Tips: Please keep the boxes that  topical medications come in in order to help keep track of the instructions about where and how to use these. Pharmacies typically print the medication instructions only on the boxes and not directly on the medication tubes.   If your medication is too expensive, please contact our office at [phone] option 4 or send [place] a message through MyChart.   We are unable to tell what your co-pay for medications will be in advance as this is different depending on your insurance coverage. However, we may be able to find a substitute medication at lower cost or fill out paperwork to get insurance to cover a needed medication.   If a prior authorization is required to get your medication covered by your insurance company, please allow [place] 1-2 business days to complete this process.  Drug prices often vary depending on where the prescription is filled and some pharmacies may offer cheaper prices.  The website www.goodrx.com contains coupons for medications through different pharmacies. The prices here do not account for what the cost may be with help from insurance (it may be cheaper with your insurance), but the website can give you the price if you did not use any insurance.  - You can print the associated coupon and take it with your prescription to the pharmacy.  - You may also stop by our office during regular business hours and pick up a GoodRx coupon card.  - If you need your prescription sent electronically to a different  pharmacy, notify our office through [facility] or by phone at [phone] option 4.

## 2020-10-06 NOTE — Progress Notes (Signed)
New Patient Visit  Subjective  Deborah Avila is a 60 y.o. female who presents for the following: Skin Problem (Patient here today for a spot at the face, present for a long time with no changes. Sometimes it is sore. Patient also with a few spots at neck. ).  Patient with no personal hx of skin cancer. She does advise that her father has had skin cancer in the past but she does not know what kind it was.   The following portions of the chart were reviewed this encounter and updated as appropriate:   Tobacco  Allergies  Meds  Problems  Med Hx  Surg Hx  Fam Hx     Review of Systems:  No other skin or systemic complaints except as noted in HPI or Assessment and Plan.  Objective  Well appearing patient in no apparent distress; mood and affect are within normal limits.  A focused examination was performed including face, neck, axilla. Relevant physical exam findings are noted in the Assessment and Plan.  right medial forehead, right cheek, right temple Smooth white papule(s).   Neck, axilla x 22 (22) Erythematous keratotic or waxy stuck-on papule or plaque.    Assessment & Plan  Milia right medial forehead, right cheek, right temple Chronic and persistent Benign-appearing.  Observation.  Call clinic for new or changing lesions.  Recommend daily use of broad spectrum spf 30+ sunscreen to sun-exposed areas.  Patient would like treatment and therefore- Samples given for patient to start Arazalo to affected areas once a day.  Inflamed seborrheic keratosis Neck, axilla x 22  Destruction of lesion - Neck, axilla x 22 Complexity: simple   Destruction method: cryotherapy   Informed consent: discussed and consent obtained   Timeout:  patient name, date of birth, surgical site, and procedure verified Lesion destroyed using liquid nitrogen: Yes   Region frozen until ice ball extended beyond lesion: Yes   Outcome: patient tolerated procedure well with no complications    Post-procedure details: wound care instructions given    Seborrheic Keratoses - Stuck-on, waxy, tan-brown papules and/or plaques  - Benign-appearing - Discussed benign etiology and prognosis. - Observe - Call for any changes  Acrochordons (Skin Tags) - Removal desired by patient - Fleshy, skin-colored pedunculated papules - Benign appearing.  - Patient desires removal. Reviewed that this is not covered by insurance and they will be charged a cosmetic fee for removal. Patient signed non-covered consent.  - Prior to the procedure, reviewed the expected small wound. Also reviewed the risk of leaving a small scar and the small risk of infection.  PROCEDURE - The areas were prepped with isopropyl alcohol. A small amount of lidocaine 1% with epinephrine was injected at the base of each lesion to achieve good local anesthesia. The skin tags were removed using a snip technique. Aluminum chloride was used for hemostasis. Petrolatum and a bandage were applied. The procedure was tolerated well. - Wound care was reviewed with the patient. They were advised to call with any concerns. Total number of treated acrochordons 15   Actinic Damage - chronic, secondary to cumulative UV radiation exposure/sun exposure over time - diffuse scaly erythematous macules with underlying dyspigmentation - Recommend daily broad spectrum sunscreen SPF 30+ to sun-exposed areas, reapply every 2 hours as needed.  - Recommend staying in the shade or wearing long sleeves, sun glasses (UVA+UVB protection) and wide brim hats (4-inch brim around the entire circumference of the hat). - Call for new or changing lesions.  Return in about 3 months (around 01/06/2021) for ISK follow up.  Anise Salvo, RMA, am acting as scribe for Armida Sans, MD . Documentation: I have reviewed the above documentation for accuracy and completeness, and I agree with the above.  Armida Sans, MD

## 2020-10-11 ENCOUNTER — Encounter: Payer: Self-pay | Admitting: Dermatology

## 2020-11-17 ENCOUNTER — Encounter: Payer: Self-pay | Admitting: Ophthalmology

## 2020-11-26 ENCOUNTER — Other Ambulatory Visit: Payer: Self-pay

## 2020-11-26 ENCOUNTER — Ambulatory Visit: Payer: BC Managed Care – PPO | Admitting: Anesthesiology

## 2020-11-26 ENCOUNTER — Ambulatory Visit
Admission: RE | Admit: 2020-11-26 | Discharge: 2020-11-26 | Disposition: A | Discharge status: Home / Self Care | Payer: BC Managed Care – PPO | Source: Ambulatory Visit | Attending: Ophthalmology | Admitting: Ophthalmology

## 2020-11-26 ENCOUNTER — Encounter: Payer: Self-pay | Admitting: Ophthalmology

## 2020-11-26 ENCOUNTER — Encounter
Admission: RE | Disposition: A | Discharge status: Home / Self Care | Payer: Self-pay | Source: Ambulatory Visit | Attending: Ophthalmology

## 2020-11-26 DIAGNOSIS — Z7984 Long term (current) use of oral hypoglycemic drugs: Secondary | ICD-10-CM | POA: Insufficient documentation

## 2020-11-26 DIAGNOSIS — Z91041 Radiographic dye allergy status: Secondary | ICD-10-CM | POA: Diagnosis not present

## 2020-11-26 DIAGNOSIS — H2511 Age-related nuclear cataract, right eye: Secondary | ICD-10-CM | POA: Insufficient documentation

## 2020-11-26 DIAGNOSIS — E1136 Type 2 diabetes mellitus with diabetic cataract: Secondary | ICD-10-CM | POA: Diagnosis present

## 2020-11-26 DIAGNOSIS — Z882 Allergy status to sulfonamides status: Secondary | ICD-10-CM | POA: Insufficient documentation

## 2020-11-26 DIAGNOSIS — Z885 Allergy status to narcotic agent status: Secondary | ICD-10-CM | POA: Diagnosis not present

## 2020-11-26 DIAGNOSIS — Z79899 Other long term (current) drug therapy: Secondary | ICD-10-CM | POA: Diagnosis not present

## 2020-11-26 DIAGNOSIS — Z880 Allergy status to penicillin: Secondary | ICD-10-CM | POA: Diagnosis not present

## 2020-11-26 DIAGNOSIS — Z794 Long term (current) use of insulin: Secondary | ICD-10-CM | POA: Diagnosis not present

## 2020-11-26 HISTORY — PX: CATARACT EXTRACTION W/PHACO: SHX586

## 2020-11-26 HISTORY — DX: Unspecified asthma, uncomplicated: J45.909

## 2020-11-26 LAB — GLUCOSE, CAPILLARY
Glucose-Capillary: 97 mg/dL (ref 70–99)
Glucose-Capillary: 87 mg/dL (ref 70–99)

## 2020-11-26 SURGERY — PHACOEMULSIFICATION, CATARACT, WITH IOL INSERTION
Anesthesia: Monitor Anesthesia Care | Site: Eye | Laterality: Right

## 2020-11-26 MED ORDER — MOXIFLOXACIN HCL 0.5 % OP SOLN
OPHTHALMIC | Status: DC | PRN
  Administered 2020-11-26: 0.2 mL via OPHTHALMIC

## 2020-11-26 MED ORDER — SIGHTPATH DOSE#1 BSS IO SOLN
INTRAOCULAR | Status: DC | PRN
  Administered 2020-11-26: 52 mL via OPHTHALMIC

## 2020-11-26 MED ORDER — MIDAZOLAM HCL 2 MG/2ML IJ SOLN
INTRAMUSCULAR | Status: DC | PRN
  Administered 2020-11-26 (×2): 1 mg via INTRAVENOUS

## 2020-11-26 MED ORDER — SIGHTPATH DOSE#1 BSS IO SOLN
INTRAOCULAR | Status: DC | PRN
  Administered 2020-11-26: 2 mL via INTRAMUSCULAR

## 2020-11-26 MED ORDER — ARMC OPHTHALMIC DILATING DROPS
1.0000 "application " | OPHTHALMIC | Status: DC | PRN
  Administered 2020-11-26 (×3): 1 via OPHTHALMIC

## 2020-11-26 MED ORDER — LACTATED RINGERS IV SOLN: INTRAVENOUS | Status: DC

## 2020-11-26 MED ORDER — BRIMONIDINE TARTRATE-TIMOLOL 0.2-0.5 % OP SOLN
OPHTHALMIC | Status: DC | PRN
  Administered 2020-11-26: 1 [drp] via OPHTHALMIC

## 2020-11-26 MED ORDER — SIGHTPATH DOSE#1 NA HYALUR & NA CHOND-NA HYALUR IO KIT
PACK | INTRAOCULAR | Status: DC | PRN
  Administered 2020-11-26: 1 via OPHTHALMIC

## 2020-11-26 MED ORDER — TETRACAINE HCL 0.5 % OP SOLN
1.0000 [drp] | OPHTHALMIC | Status: DC | PRN
  Administered 2020-11-26 (×3): 1 [drp] via OPHTHALMIC

## 2020-11-26 MED ORDER — SIGHTPATH DOSE#1 BSS IO SOLN
INTRAOCULAR | Status: DC | PRN
  Administered 2020-11-26: 15 mL via INTRAOCULAR

## 2020-11-26 MED ORDER — FENTANYL CITRATE (PF) 100 MCG/2ML IJ SOLN
INTRAMUSCULAR | Status: DC | PRN
  Administered 2020-11-26 (×2): 50 ug via INTRAVENOUS

## 2020-11-26 SURGICAL SUPPLY — 24 items
CANNULA ANT/CHMB 27GA (MISCELLANEOUS) IMPLANT
GLOVE SRG 8 PF TXTR STRL LF DI (GLOVE) ×1 IMPLANT
GLOVE SURG ENC TEXT LTX SZ7.5 (GLOVE) ×2 IMPLANT
GLOVE SURG GAMMEX PI TX LF 7.5 (GLOVE) IMPLANT
GLOVE SURG UNDER POLY LF SZ8 (GLOVE) ×2
GOWN STRL REUS W/ TWL LRG LVL3 (GOWN DISPOSABLE) ×2 IMPLANT
GOWN STRL REUS W/TWL LRG LVL3 (GOWN DISPOSABLE) ×4
LENS IOL EYHANCE TORIC II ×2 IMPLANT
LENS IOL EYHANCE TRC 300 19.0 ×1 IMPLANT
MARKER SKIN DUAL TIP RULER LAB (MISCELLANEOUS) ×2 IMPLANT
NDL RETROBULBAR .5 NSTRL (NEEDLE) IMPLANT
NEEDLE CAPSULORHEX 25GA (NEEDLE) IMPLANT
NEEDLE FILTER BLUNT 18X 1/2SAF (NEEDLE) ×2
NEEDLE FILTER BLUNT 18X1 1/2 (NEEDLE) ×2 IMPLANT
PACK EYE AFTER SURG (MISCELLANEOUS) IMPLANT
RING MALYGIN 7.0 (MISCELLANEOUS) IMPLANT
SUT ETHILON 10-0 CS-B-6CS-B-6 (SUTURE)
SUT VICRYL  9 0 (SUTURE)
SUT VICRYL 9 0 (SUTURE) IMPLANT
SUTURE EHLN 10-0 CS-B-6CS-B-6 (SUTURE) IMPLANT
SYR 3ML LL SCALE MARK (SYRINGE) ×4 IMPLANT
SYR TB 1ML LUER SLIP (SYRINGE) ×2 IMPLANT
WATER STERILE IRR 250ML POUR (IV SOLUTION) ×2 IMPLANT
WIPE NON LINTING 3.25X3.25 (MISCELLANEOUS) ×2 IMPLANT

## 2020-11-26 NOTE — H&P (Signed)
[facility]   Primary Care Physician:  Bunnie Pion, FNP Ophthalmologist: Dr. Leandrew Koyanagi  Pre-Procedure History & Physical: HPI:  Deborah Avila is a 60 y.o. female here for ophthalmic surgery.   Past Medical History:  Diagnosis Date   Asthma    Diabetes mellitus without complication (HCC)    GERD (gastroesophageal reflux disease)     Past Surgical History:  Procedure Laterality Date   ABDOMINAL HYSTERECTOMY     APPENDECTOMY     BREAST BIOPSY Right 06/19/2019   [place] core coil clip    CHOLECYSTECTOMY     COLONOSCOPY WITH ESOPHAGOGASTRODUODENOSCOPY (EGD)     COLONOSCOPY WITH PROPOFOL N/A 07/02/2019   Procedure: COLONOSCOPY WITH PROPOFOL;  Surgeon: Toledo, Benay Pike, MD;  Location: [facility] ENDOSCOPY;  Service: Gastroenterology;  Laterality: N/A;   ESOPHAGOGASTRODUODENOSCOPY (EGD) WITH PROPOFOL N/A 07/02/2019   Procedure: ESOPHAGOGASTRODUODENOSCOPY (EGD) WITH PROPOFOL;  Surgeon: Toledo, Benay Pike, MD;  Location: [facility] ENDOSCOPY;  Service: Gastroenterology;  Laterality: N/A;   TONSILLECTOMY      Prior to Admission medications   Medication Sig Start Date End Date Taking? Authorizing Provider  Apple Cider Vinegar 300 MG TABS Take 450 mg by mouth in the morning and at bedtime.   Yes [provider]  busPIRone (BUSPAR) 5 MG tablet Take 5 mg by mouth 2 (two) times daily. 09/06/18  Yes [provider]  cetirizine (ZYRTEC) 10 MG tablet Take 10 mg by mouth daily. 07/25/18  Yes [provider]  Cinnamon 500 MG capsule Take 1,000 mg by mouth daily.   Yes [provider]  empagliflozin (JARDIANCE) 25 MG TABS tablet Take 25 mg by mouth daily.   Yes [provider]  fluticasone (VERAMYST) 27.5 MCG/SPRAY nasal spray Place 2 sprays into the nose daily as needed for rhinitis.   Yes [provider]  HUMALOG KWIKPEN 100 UNIT/ML KwikPen INJECT 10 UNITS INTO THE SKIN 3 TIMES A DAY BEFORE MEALS 06/14/18  Yes [provider]   Insulin Glargine (LANTUS SOLOSTAR) 100 UNIT/ML Solostar Pen 50 Units. 03/07/18  Yes [provider]  Rolan Lipa 290 MCG CAPS capsule  09/29/18  Yes [provider]  lisinopril (ZESTRIL) 2.5 MG tablet  11/01/18  Yes [provider]  magnesium oxide (MAG-OX) 400 MG tablet Take 400 mg by mouth 2 (two) times daily.   Yes [provider]  metFORMIN (GLUCOPHAGE) 1000 MG tablet  09/29/18  Yes [provider]  Multiple Vitamins-Minerals (MULTIVITAMIN ADULTS PO) Take by mouth daily.   Yes [provider]  Omega 3-6-9 Fatty Acids (OMEGA-3-6-9 PO) Take 1,030 mg by mouth 2 (two) times daily.   Yes [provider]  omeprazole (PRILOSEC) 20 MG capsule Take by mouth daily. 07/27/18  Yes [provider]  pravastatin (PRAVACHOL) 20 MG tablet  11/01/18  Yes [provider]  PROAIR HFA 108 [phone] Base) MCG/ACT inhaler  09/29/18  Yes [provider]  SYMBICORT 160-4.5 MCG/ACT inhaler  09/29/18  Yes [provider]  Blood Glucose Monitoring Suppl (GLUCOCOM BLOOD GLUCOSE MONITOR) DEVI Test 3 times daily Dx Code: E11.65 06/23/17   [provider]  glucose blood test strip Test 3 times daily Dx Code: E11.65 06/23/17   [provider]  hydrOXYzine (ATARAX/VISTARIL) 25 MG tablet Take 1 tablet (25 mg total) by mouth every 6 (six) hours. Patient not taking: Reported on 11/17/2020 07/14/20   Faustino Congress, NP  Lancets (ACCU-CHEK MULTICLIX) lancets Test 3 times daily Dx Code: E11.65 06/23/17   [provider]  meclizine (ANTIVERT) 25 MG tablet Take by mouth. Patient not taking: Reported on 11/17/2020 10/20/15   [provider]  Na Sulfate-K Sulfate-Mg Sulf (SUPREP BOWEL PREP KIT) 17.5-3.13-1.6 GM/177ML SOLN Take 1 kit by mouth as directed. Patient not taking: Reported on 07/02/2019 11/13/18   Lucilla Lame, MD  oxybutynin (DITROPAN-XL) 5 MG 24 hr tablet  04/09/15   [provider]    Allergies as of  11/03/2020 - Review Complete 10/11/2020  Allergen Reaction Noted   Iodinated diagnostic agents Anaphylaxis 02/28/2015   Morphine Anaphylaxis 02/28/2015   Penicillins Anaphylaxis 02/28/2015   Shellfish allergy Anaphylaxis 07/02/2019   Codeine Rash and Swelling 02/28/2015   Sulfa antibiotics Rash and Swelling 02/28/2015    History reviewed. No pertinent family history.  Social History   Socioeconomic History   Marital status: Divorced    Spouse name: Not on file   Number of children: Not on file   Years of education: Not on file   Highest education level: Not on file  Occupational History   Not on file  Tobacco Use   Smoking status: Never   Smokeless tobacco: Never  Vaping Use   Vaping Use: Never used  Substance and Sexual Activity   Alcohol use: Never   Drug use: Never   Sexual activity: Not on file  Other Topics Concern   Not on file  Social History Narrative   Not on file   Social Determinants of Health   Financial Resource Strain: Not on file  Food Insecurity: Not on file  Transportation Needs: Not on file  Physical Activity: Not on file  Stress: Not on file  Social Connections: Not on file  Intimate Partner Violence: Not on file    Review of Systems: See HPI, otherwise negative ROS  Physical Exam: BP (!) 151/88   Pulse 93   Temp (!) 97.2 F (36.2 C) (Temporal)   Resp 18   Ht 5' 1"  (1.549 m)   Wt 91.6 kg   SpO2 96%   BMI 38.17 kg/m  General:   Alert,  pleasant and cooperative in NAD Head:  Normocephalic and atraumatic. Lungs:  Clear to auscultation.    Heart:  Regular rate and rhythm.   Impression/Plan: Deborah Avila is here for ophthalmic surgery.  Risks, benefits, limitations, and alternatives regarding ophthalmic surgery have been reviewed with the patient.  Questions have been answered.  All parties agreeable.   Leandrew Koyanagi, MD  11/26/2020, 7:40 AM

## 2020-11-26 NOTE — Anesthesia Preprocedure Evaluation (Signed)
Anesthesia Evaluation  Patient identified by MRN, date of birth, ID band Patient awake    Reviewed: NPO status   History of Anesthesia Complications Negative for: history of anesthetic complications  Airway Mallampati: II  TM Distance: >3 FB Neck ROM: full    Dental no notable dental hx.    Pulmonary asthma (mild) ,    Pulmonary exam normal        Cardiovascular Exercise Tolerance: Good hypertension, Normal cardiovascular exam     Neuro/Psych Anxiety negative neurological ROS     GI/Hepatic Neg liver ROS, GERD  Controlled,ibs   Endo/Other  diabetesMorbid obesity (bmi 38)  Renal/GU negative Renal ROS  negative genitourinary   Musculoskeletal   Abdominal   Peds  Hematology negative hematology ROS (+)   Anesthesia Other Findings   Reproductive/Obstetrics                             Anesthesia Physical Anesthesia Plan  ASA: 2  Anesthesia Plan: MAC   Post-op Pain Management:    Induction:   PONV Risk Score and Plan: 2 and Midazolam and TIVA  Airway Management Planned:   Additional Equipment:   Intra-op Plan:   Post-operative Plan:   Informed Consent: I have reviewed the patients History and Physical, chart, labs and discussed the procedure including the risks, benefits and alternatives for the proposed anesthesia with the patient or authorized representative who has indicated his/her understanding and acceptance.       Plan Discussed with: CRNA  Anesthesia Plan Comments:         Anesthesia Quick Evaluation

## 2020-11-26 NOTE — Transfer of Care (Signed)
Immediate Anesthesia Transfer of Care Note  Patient: Deborah Avila  Procedure(s) Performed: CATARACT EXTRACTION PHACO AND INTRAOCULAR LENS PLACEMENT (IOC) RIGHT DIABETIC  eyhance toric lens 4.55 00:45.9 (Right: Eye)  Patient Location: PACU  Anesthesia Type: MAC  Level of Consciousness: awake, alert  and patient cooperative  Airway and Oxygen Therapy: Patient Spontanous Breathing and Patient connected to supplemental oxygen  Post-op Assessment: Post-op Vital signs reviewed, Patient's Cardiovascular Status Stable, Respiratory Function Stable, Patent Airway and No signs of Nausea or vomiting  Post-op Vital Signs: Reviewed and stable  Complications: No notable events documented.

## 2020-11-26 NOTE — Anesthesia Postprocedure Evaluation (Signed)
Anesthesia Post Note  Patient: Deborah Avila  Procedure(s) Performed: CATARACT EXTRACTION PHACO AND INTRAOCULAR LENS PLACEMENT (IOC) RIGHT DIABETIC  eyhance toric lens 4.55 00:45.9 (Right: Eye)     Patient location during evaluation: PACU Anesthesia Type: MAC Level of consciousness: awake and alert Pain management: pain level controlled Vital Signs Assessment: post-procedure vital signs reviewed and stable Respiratory status: spontaneous breathing, nonlabored ventilation, respiratory function stable and patient connected to nasal cannula oxygen Cardiovascular status: stable and blood pressure returned to baseline Postop Assessment: no apparent nausea or vomiting Anesthetic complications: no   No notable events documented.  Fidel Levy

## 2020-11-26 NOTE — Op Note (Signed)
LOCATION:  [facility]   PREOPERATIVE DIAGNOSIS:  Nuclear sclerotic cataract of the right eye.  H25.11   POSTOPERATIVE DIAGNOSIS:  Nuclear sclerotic cataract of the right eye.   PROCEDURE:  Phacoemulsification with Toric posterior chamber intraocular lens placement of the right eye.  Ultrasound time: Procedure(s) with comments: CATARACT EXTRACTION PHACO AND INTRAOCULAR LENS PLACEMENT (IOC) RIGHT DIABETIC  eyhance toric lens 4.55 00:45.9 (Right) - Diabetic  LENS:   Implant Name Type Inv. Item Serial No. Manufacturer Lot No. LRB No. Used Action  Eyhance Toric II IOL 19.0 Intraocular Lens  [id number] [employer]  Right 1 Implanted     [id number] Toric intraocular lens with 3.0 diopters of cylindrical power with axis orientation at 96 degrees.   SURGEON:  Deirdre Evener, MD   ANESTHESIA: Topical with tetracaine drops and 2% Xylocaine jelly, augmented with 1% preservative-free intracameral lidocaine. .   COMPLICATIONS:  None.   DESCRIPTION OF PROCEDURE:  The patient was identified in the holding room and transported to the operating suite and placed in the supine position under the operating microscope.  The right eye was identified as the operative eye, and it was prepped and draped in the usual sterile ophthalmic fashion.    A clear-corneal paracentesis incision was made at the 12:00 position.  0.5 ml of preservative-free 1% lidocaine was injected into the anterior chamber. The anterior chamber was filled with Viscoat.  A 2.4 millimeter near clear corneal incision was then made at the 9:00 position.  A cystotome and capsulorrhexis forceps were then used to make a curvilinear capsulorrhexis.  Hydrodissection and hydrodelineation were then performed using balanced salt solution.   Phacoemulsification was then used in stop and chop fashion to remove the lens, nucleus and epinucleus.  The remaining cortex was aspirated using the irrigation and aspiration handpiece.  Provisc  viscoelastic was then placed into the capsular bag to distend it for lens placement.  The Verion digital marker was used to align the implant at the intended axis.   A Toric lens was then injected into the capsular bag.  It was rotated clockwise until the axis marks on the lens were approximately 15 degrees in the counterclockwise direction to the intended alignment.  The viscoelastic was aspirated from the eye using the irrigation aspiration handpiece.  Then, a Koch spatula through the sideport incision was used to rotate the lens in a clockwise direction until the axis markings of the intraocular lens were lined up with the Verion alignment.  Balanced salt solution was then used to hydrate the wounds. Vigamox 0.2 ml of a 1mg  per ml solution was injected into the anterior chamber for a dose of 0.2 mg of intracameral antibiotic at the completion of the case.    The eye was noted to have a physiologic pressure and there was no wound leak noted.   Timolol and Brimonidine drops were applied to the eye.  The patient was taken to the recovery room in stable condition having had no complications of anesthesia or surgery.  Rastus Borton 11/26/2020, 8:38 AM

## 2020-11-27 ENCOUNTER — Encounter: Payer: Self-pay | Admitting: Ophthalmology

## 2020-11-27 IMAGING — MG MM DIGITAL DIAGNOSTIC UNILAT*R* W/ TOMO W/ CAD
4 series · 4 of 12 positions shown · non-contrast
Comparison: Screening study dated 05/22/2019.

CLINICAL DATA: Screening recall for a possible right breast mass.

EXAM:
DIGITAL DIAGNOSTIC RIGHT MAMMOGRAM WITH CAD AND TOMO
ULTRASOUND RIGHT BREAST

[R CC synth-2D]
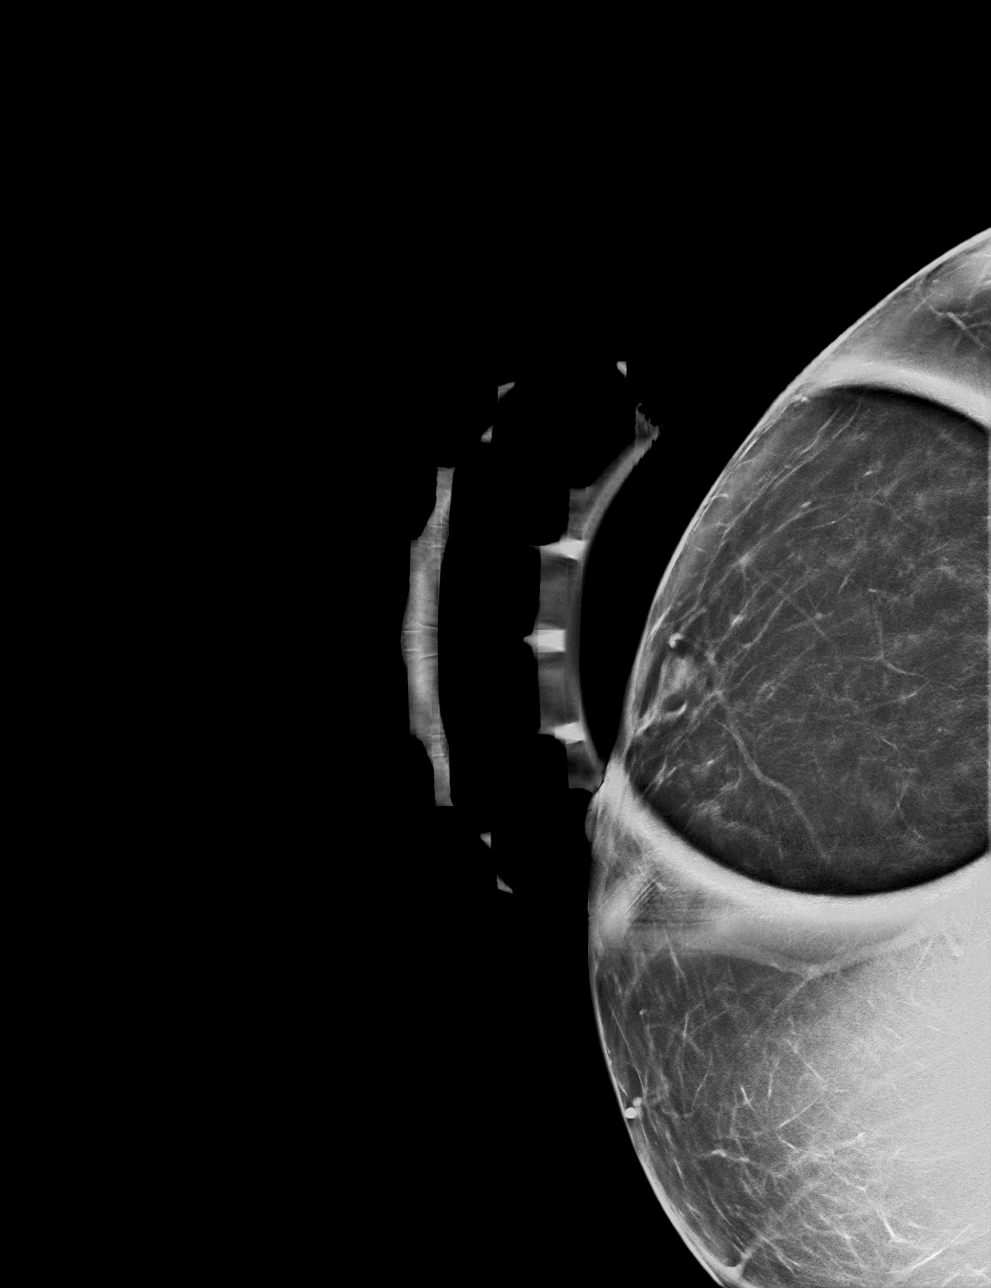

[R MLO synth-2D]
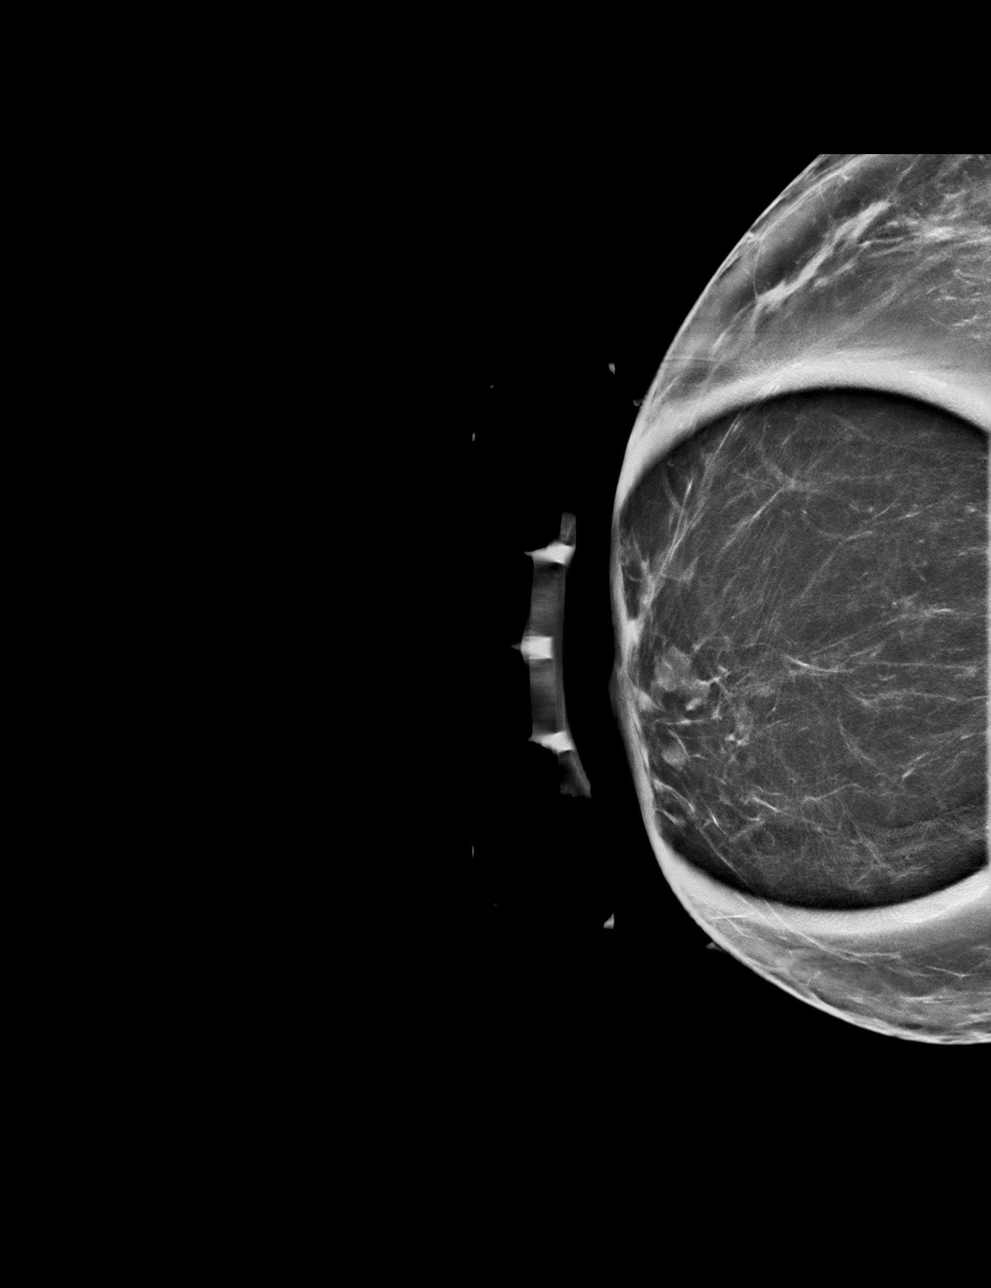

[R MLO tomo · tomo slice 39/78.0]
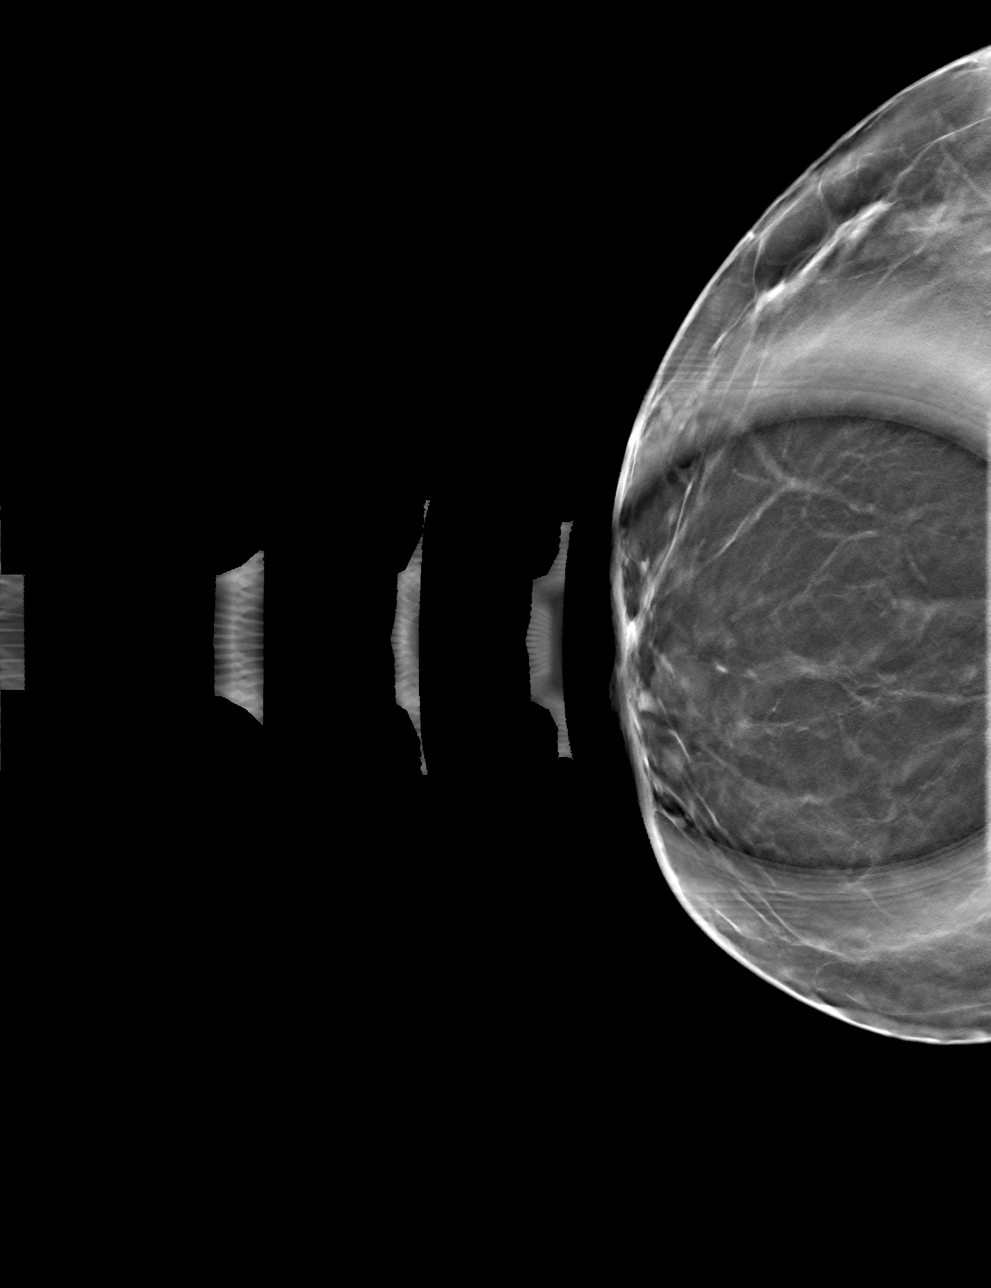

[R CC tomo · tomo slice 32/63.0]
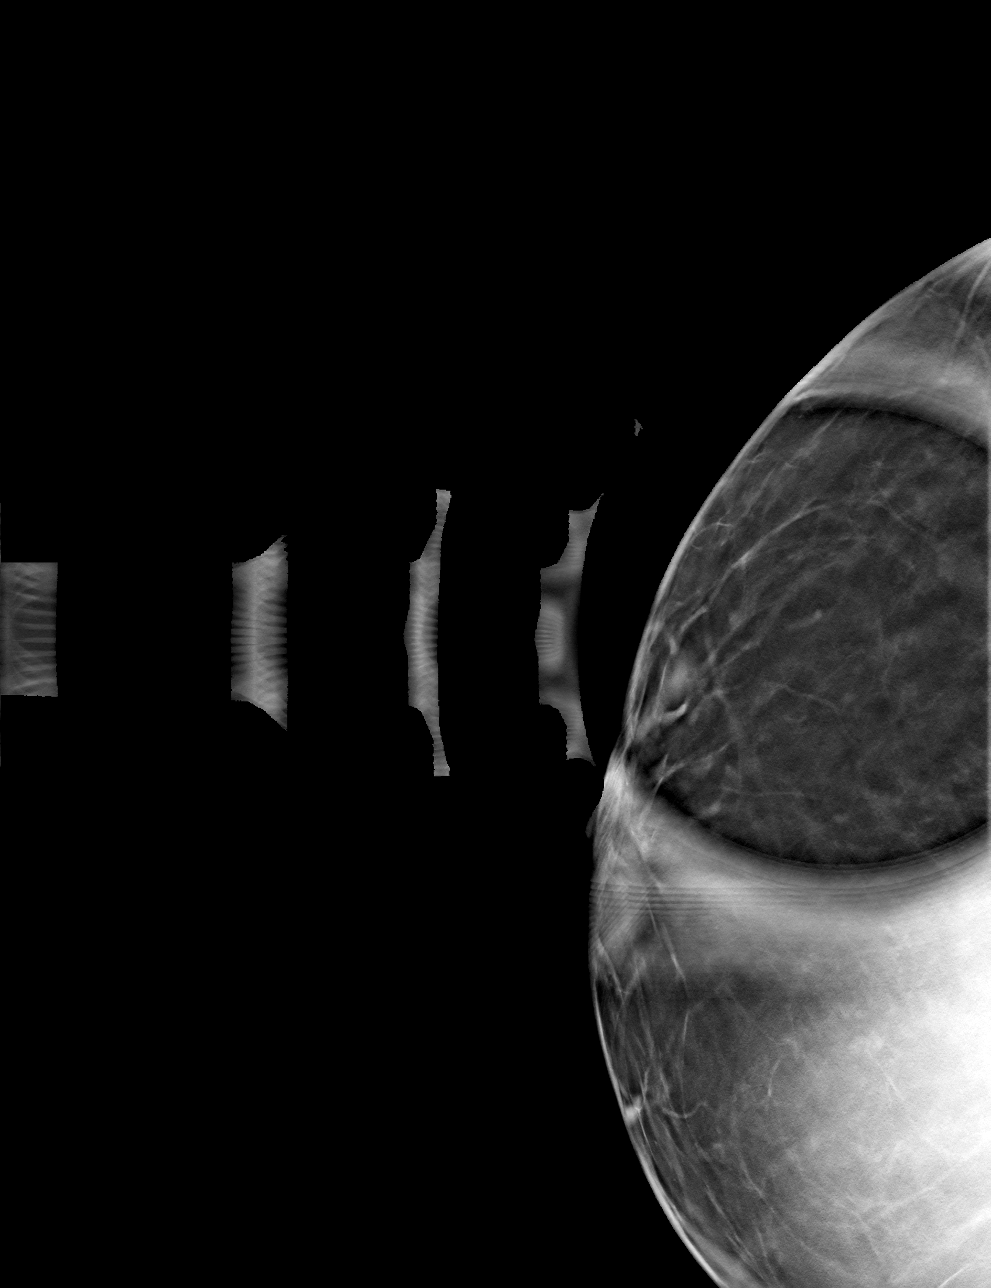

[4 of 12 positions shown; findings below may reference images not displayed]

No older exams
available.

ACR Breast Density Category b: There are scattered areas of
fibroglandular density.
FINDINGS: The possible mass in the lateral retroareolar right breast persists
on the diagnostic spot-compression images. This is best defined on
the spot compression MLO view has a somewhat triangular shaped
partly circumscribed mass. There is a smaller mass on the screening
exam, which projects inferiorly, in the central 6 o'clock position,
approximately 6 mm in size with mildly lobulated, circumscribed
margins.

Mammographic images were processed with CAD.

On physical exam, there is a small, elongated, superficial, somewhat
firm mass in the lateral retroareolar right breast.

Targeted ultrasound is performed, showing a dilated duct at 9
o'clock, retroareolar, containing hypoechoic internal material at
demonstrates blood flow on color Doppler analysis. The lesion
measures 11 x 3 x 11 mm in size. This corresponds in size and shape
to the retroareolar mammographic mass. In the 7 o'clock position, 4
cm the nipple, there is a small cyst measuring 6 x 5 x 5 mm, which
is consistent size, shape and location to the smaller lobulated mass
seen inferiorly on the mammogram.
IMPRESSION: 1. 11 mm intraductal mass in the 9 o'clock retroareolar right
breast. Tissue sampling is recommended.
2. Benign 6 mm cyst in the 7 o'clock position of the right breast, 4
cm from the nipple.

RECOMMENDATION:
1. Ultrasound-guided core needle biopsy of the 9 o'clock
retroareolar right breast intraductal mass. Prior to the biopsy, the
patient should have a right axillary ultrasound to assess for
lymphadenopathy. This was inadvertently not performed at the time of
diagnostic imaging.

I have discussed the findings and recommendations with the patient.
If applicable, a reminder letter will be sent to the patient
regarding the next appointment.

BI-RADS CATEGORY  4: Suspicious.

## 2020-11-27 IMAGING — US US BREAST*R* LIMITED INC AXILLA
1 series · 13 of 13 positions shown · non-contrast
Comparison: Screening study dated 05/22/2019.

CLINICAL DATA: Screening recall for a possible right breast mass.

EXAM:
DIGITAL DIAGNOSTIC RIGHT MAMMOGRAM WITH CAD AND TOMO
ULTRASOUND RIGHT BREAST

[Series 1: us breast*right* limited inc axilla · 0.06mm/px · 13 of 13 slices shown]
[im 1/13]
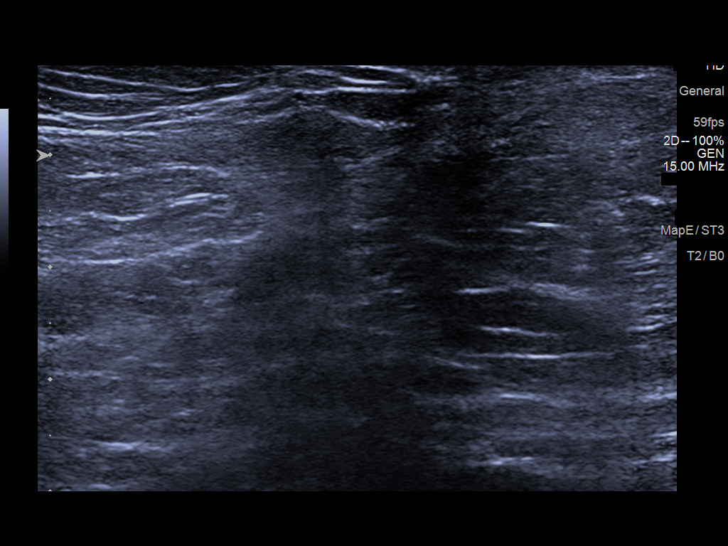
[im 2/13]
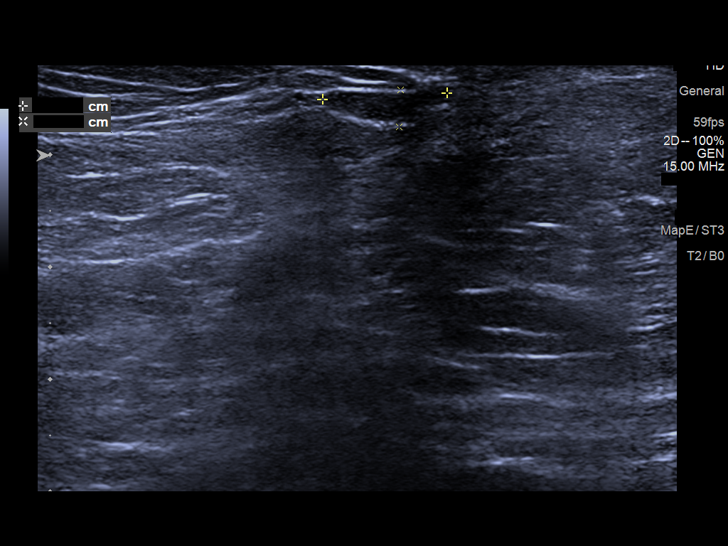
[im 3/13]
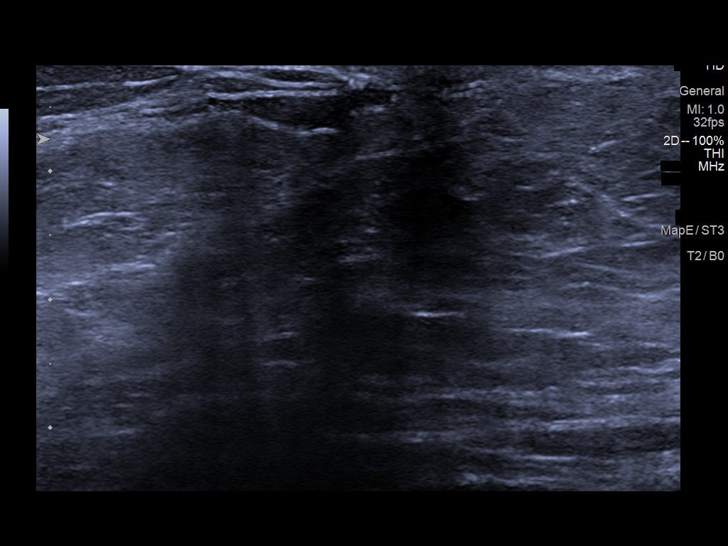
[im 4/13]
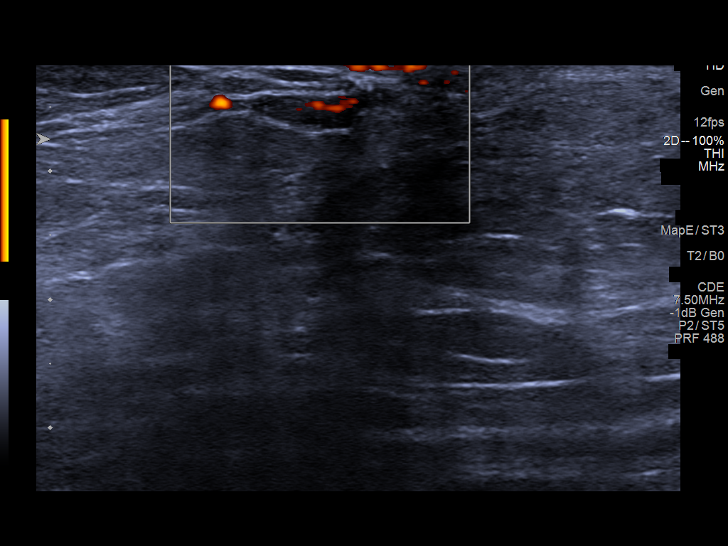
[im 5/13]
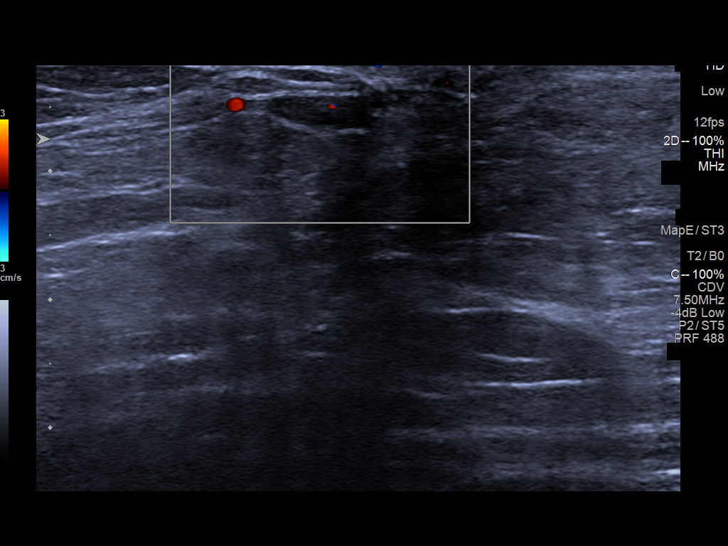
[im 6/13]
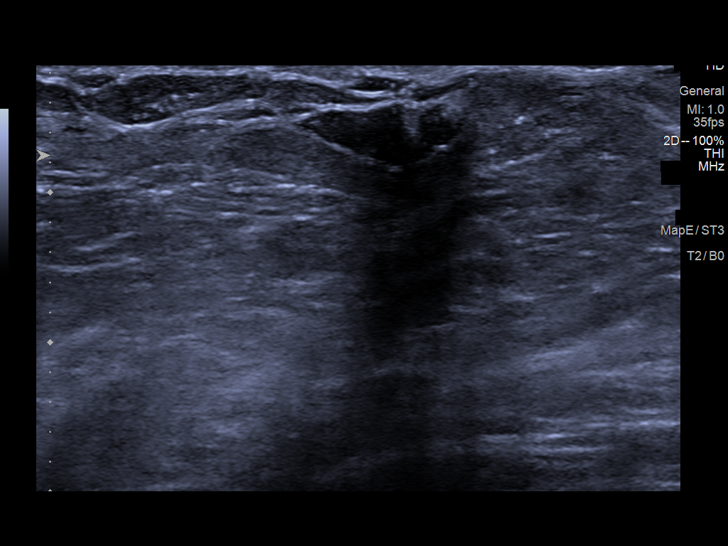
[im 7/13]
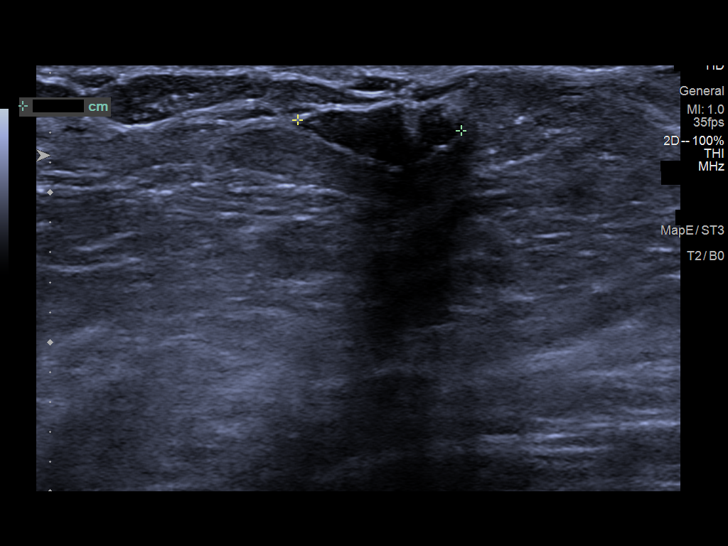
[im 8/13]
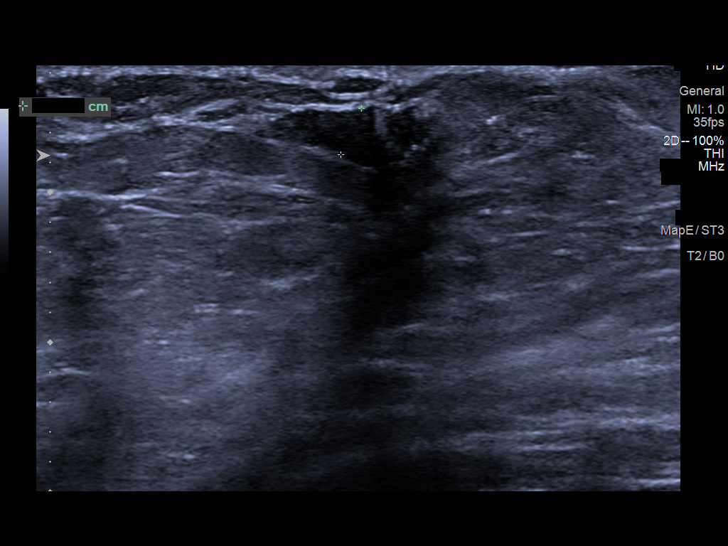
[im 9/13]
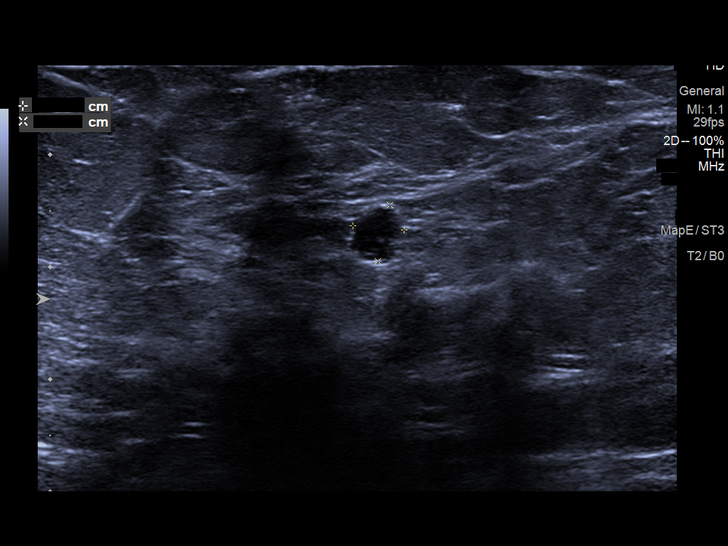
[im 10/13]
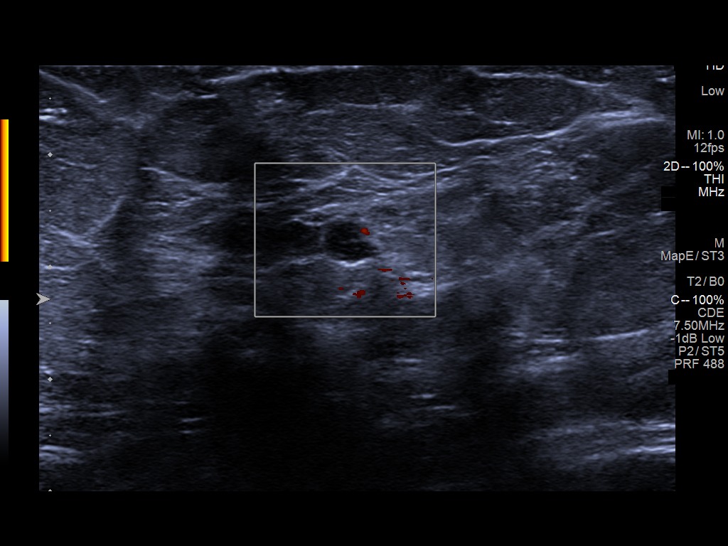
[im 11/13]
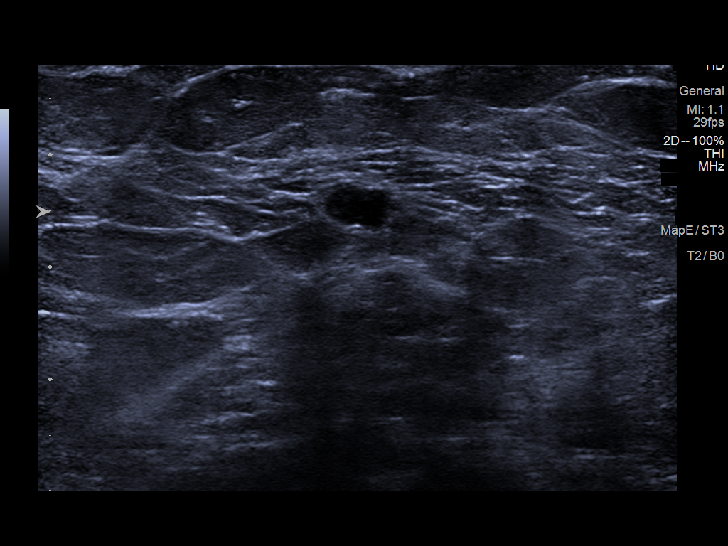
[im 12/13]
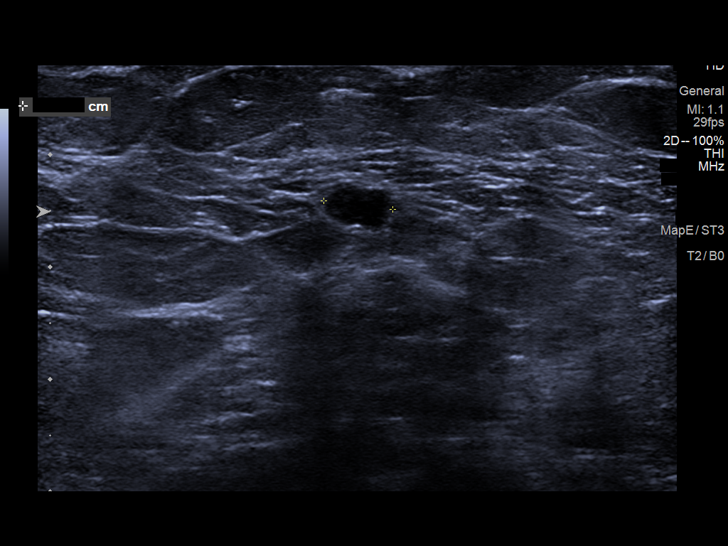
[im 13/13]
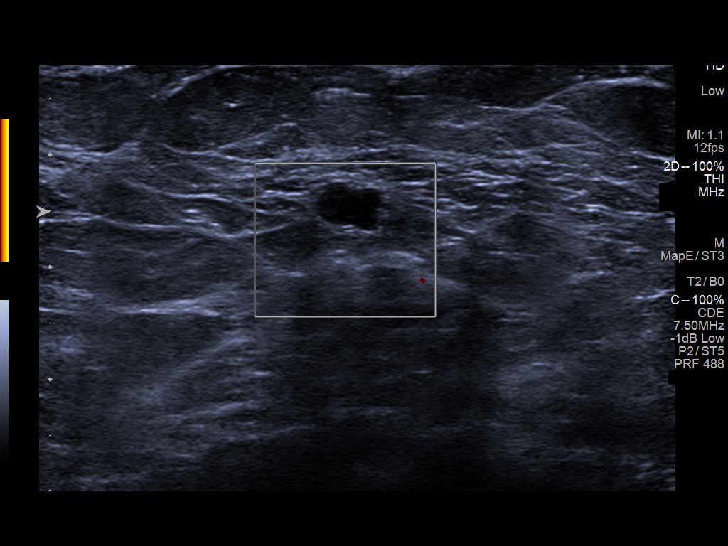

[13 of 13 positions shown; findings below may reference images not displayed]

No older exams
available.

ACR Breast Density Category b: There are scattered areas of
fibroglandular density.
FINDINGS: The possible mass in the lateral retroareolar right breast persists
on the diagnostic spot-compression images. This is best defined on
the spot compression MLO view has a somewhat triangular shaped
partly circumscribed mass. There is a smaller mass on the screening
exam, which projects inferiorly, in the central 6 o'clock position,
approximately 6 mm in size with mildly lobulated, circumscribed
margins.

Mammographic images were processed with CAD.

On physical exam, there is a small, elongated, superficial, somewhat
firm mass in the lateral retroareolar right breast.

Targeted ultrasound is performed, showing a dilated duct at 9
o'clock, retroareolar, containing hypoechoic internal material at
demonstrates blood flow on color Doppler analysis. The lesion
measures 11 x 3 x 11 mm in size. This corresponds in size and shape
to the retroareolar mammographic mass. In the 7 o'clock position, 4
cm the nipple, there is a small cyst measuring 6 x 5 x 5 mm, which
is consistent size, shape and location to the smaller lobulated mass
seen inferiorly on the mammogram.
IMPRESSION: 1. 11 mm intraductal mass in the 9 o'clock retroareolar right
breast. Tissue sampling is recommended.
2. Benign 6 mm cyst in the 7 o'clock position of the right breast, 4
cm from the nipple.

RECOMMENDATION:
1. Ultrasound-guided core needle biopsy of the 9 o'clock
retroareolar right breast intraductal mass. Prior to the biopsy, the
patient should have a right axillary ultrasound to assess for
lymphadenopathy. This was inadvertently not performed at the time of
diagnostic imaging.

I have discussed the findings and recommendations with the patient.
If applicable, a reminder letter will be sent to the patient
regarding the next appointment.

BI-RADS CATEGORY  4: Suspicious.

## 2021-01-05 ENCOUNTER — Ambulatory Visit: Payer: BC Managed Care – PPO | Admitting: Dermatology

## 2021-08-25 ENCOUNTER — Ambulatory Visit
Admission: EM | Admit: 2021-08-25 | Discharge: 2021-08-25 | Disposition: A | Discharge status: Home / Self Care | Payer: BC Managed Care – PPO | Attending: Nurse Practitioner | Admitting: Nurse Practitioner

## 2021-08-25 ENCOUNTER — Encounter: Payer: Self-pay | Admitting: Emergency Medicine

## 2021-08-25 DIAGNOSIS — J45901 Unspecified asthma with (acute) exacerbation: Secondary | ICD-10-CM | POA: Diagnosis not present

## 2021-08-25 DIAGNOSIS — R059 Cough, unspecified: Secondary | ICD-10-CM

## 2021-08-25 MED ORDER — ALBUTEROL SULFATE HFA 108 (90 BASE) MCG/ACT IN AERS: 2.0000 | INHALATION_SPRAY | Freq: Four times a day (QID) | RESPIRATORY_TRACT | 0 refills | Status: AC | PRN

## 2021-08-25 MED ORDER — PSEUDOEPH-BROMPHEN-DM 30-2-10 MG/5ML PO SYRP: 5.0000 mL | ORAL_SOLUTION | Freq: Four times a day (QID) | ORAL | 0 refills | Status: DC | PRN

## 2021-08-25 MED ORDER — PREDNISONE 20 MG PO TABS: 40.0000 mg | ORAL_TABLET | Freq: Every day | ORAL | 0 refills | Status: AC

## 2021-08-25 NOTE — ED Provider Notes (Addendum)
[facility]    CSN: [id number] Arrival date & time: 08/25/21  6283      History   Chief Complaint No chief complaint on file.   HPI Deborah Avila is a 61 y.o. female.   The history is provided by the patient.   Patient presents for right ear pain, cough and nasal congestion that started yesterday.  Patient states she was cleaning and she used ammonia and Clorox together.  She states after she finished her cleaning, she began coughing.  She also states that she has nasal congestion has been present since that time.  Patient also complains of wheezing and shortness of breath.  Patient denies sore throat, headache, chest pain, abdominal pain, nausea, vomiting, or diarrhea.  Patient states she has a history of asthma.  States that she has used her Symbicort 3 times with no relief.  She states that she also used over-the-counter Mucinex with no relief.  Past Medical History:  Diagnosis Date   Asthma    Diabetes mellitus without complication ([place])    GERD (gastroesophageal reflux disease)     Patient Active Problem List   Diagnosis Date Noted   Obesity, morbid ([place]) 03/10/2016   Hypoglycemia 11/05/2015   Essential hypertension 05/04/2015   BMI 39.0-39.9,adult 03/01/2015   Type 2 diabetes mellitus with hyperglycemia, with long-term current use of insulin ([place]) 02/28/2015    Past Surgical History:  Procedure Laterality Date   ABDOMINAL HYSTERECTOMY     APPENDECTOMY     BREAST BIOPSY Right 06/19/2019   [place] core coil clip    CATARACT EXTRACTION W/PHACO Right 11/26/2020   Procedure: CATARACT EXTRACTION PHACO AND INTRAOCULAR LENS PLACEMENT ([place]) RIGHT DIABETIC  eyhance toric lens 4.55 00:45.9;  Surgeon: Leandrew Koyanagi, MD;  Location: [place];  Service: Ophthalmology;  Laterality: Right;  Diabetic   CHOLECYSTECTOMY     COLONOSCOPY WITH ESOPHAGOGASTRODUODENOSCOPY (EGD)     COLONOSCOPY WITH PROPOFOL N/A 07/02/2019   Procedure: COLONOSCOPY WITH PROPOFOL;   Surgeon: Toledo, Benay Pike, MD;  Location: [facility];  Service: Gastroenterology;  Laterality: N/A;   ESOPHAGOGASTRODUODENOSCOPY (EGD) WITH PROPOFOL N/A 07/02/2019   Procedure: ESOPHAGOGASTRODUODENOSCOPY (EGD) WITH PROPOFOL;  Surgeon: Toledo, Benay Pike, MD;  Location: [facility];  Service: Gastroenterology;  Laterality: N/A;   TONSILLECTOMY      OB History   No obstetric history on file.      Home Medications    Prior to Admission medications   Medication Sig Start Date End Date Taking? Authorizing Provider  albuterol (VENTOLIN HFA) 108 (90 Base) MCG/ACT inhaler Inhale 2 puffs into the lungs every 6 (six) hours as needed for wheezing or shortness of breath. 08/25/21  Yes Mohamed Portlock-Warren, Alda Lea, NP  brompheniramine-pseudoephedrine-DM 30-2-10 MG/5ML syrup Take 5 mLs by mouth 4 (four) times daily as needed. 08/25/21  Yes Milbern Doescher-Warren, Alda Lea, NP  predniSONE (DELTASONE) 20 MG tablet Take 2 tablets (40 mg total) by mouth daily with breakfast for 5 days. 08/25/21 08/30/21 Yes Remie Mathison-Warren, Alda Lea, NP  Apple Cider Vinegar 300 MG TABS Take 450 mg by mouth in the morning and at bedtime.    [provider]  Blood Glucose Monitoring Suppl (GLUCOCOM BLOOD GLUCOSE MONITOR) DEVI Test 3 times daily Dx Code: E11.65 06/23/17   [provider]  busPIRone (BUSPAR) 5 MG tablet Take 5 mg by mouth 2 (two) times daily. 09/06/18   [provider]  cetirizine (ZYRTEC) 10 MG tablet Take 10 mg by mouth daily. 07/25/18   [provider]  Cinnamon 500 MG capsule Take 1,000 mg by mouth daily.    [provider]  empagliflozin (JARDIANCE) 25 MG TABS tablet Take 25 mg by mouth daily.    [provider]  fluticasone (VERAMYST) 27.5 MCG/SPRAY nasal spray Place 2 sprays into the nose daily as needed for rhinitis.    [provider]  glucose blood test strip Test 3 times daily Dx Code: E11.65 06/23/17   [provider]  HUMALOG KWIKPEN 100 UNIT/ML  KwikPen INJECT 10 UNITS INTO THE SKIN 3 TIMES A DAY BEFORE MEALS 06/14/18   [provider]  hydrOXYzine (ATARAX/VISTARIL) 25 MG tablet Take 1 tablet (25 mg total) by mouth every 6 (six) hours. Patient not taking: Reported on 11/17/2020 07/14/20   Faustino Congress, NP  Insulin Glargine (LANTUS SOLOSTAR) 100 UNIT/ML Solostar Pen 50 Units. 03/07/18   [provider]  Lancets (ACCU-CHEK MULTICLIX) lancets Test 3 times daily Dx Code: E11.65 06/23/17   [provider]  Rolan Lipa 290 MCG CAPS capsule  09/29/18   [provider]  lisinopril (ZESTRIL) 2.5 MG tablet  11/01/18   [provider]  magnesium oxide (MAG-OX) 400 MG tablet Take 400 mg by mouth 2 (two) times daily.    [provider]  meclizine (ANTIVERT) 25 MG tablet Take by mouth. Patient not taking: Reported on 11/17/2020 10/20/15   [provider]  metFORMIN (GLUCOPHAGE) 1000 MG tablet  09/29/18   [provider]  Multiple Vitamins-Minerals (MULTIVITAMIN ADULTS PO) Take by mouth daily.    [provider]  Na Sulfate-K Sulfate-Mg Sulf (SUPREP BOWEL PREP KIT) 17.5-3.13-1.6 GM/177ML SOLN Take 1 kit by mouth as directed. Patient not taking: Reported on 07/02/2019 11/13/18   Lucilla Lame, MD  Omega 3-6-9 Fatty Acids (OMEGA-3-6-9 PO) Take 1,030 mg by mouth 2 (two) times daily.    [provider]  omeprazole (PRILOSEC) 20 MG capsule Take by mouth daily. 07/27/18   [provider]  oxybutynin (DITROPAN-XL) 5 MG 24 hr tablet  04/09/15   [provider]  pravastatin (PRAVACHOL) 20 MG tablet  11/01/18   [provider]  [facility] 160-4.5 MCG/ACT inhaler  09/29/18   [provider]    Family History History reviewed. No pertinent family history.  Social History Social History   Tobacco Use   Smoking status: Never   Smokeless tobacco: Never  Vaping Use   Vaping Use: Never used  Substance Use Topics   Alcohol use: Never   Drug use: Never      Allergies   Iodinated contrast media, Morphine, Penicillins, Shellfish allergy, Codeine, and Sulfa antibiotics   Review of Systems Review of Systems Per HPI  Physical Exam Triage Vital Signs ED Triage Vitals  Enc Vitals Group     BP 08/25/21 0838 113/79     Pulse Rate 08/25/21 0838 99     Resp 08/25/21 0838 18     Temp 08/25/21 0838 98.4 F (36.9 C)     Temp Source 08/25/21 0838 Oral     SpO2 08/25/21 0838 95 %     Weight --      Height --      Head Circumference --      Peak Flow --      Pain Score 08/25/21 0839 7     Pain Loc --      Pain Edu? --      Excl. in [place]? --    No data found.  Updated Vital Signs BP 113/79 (BP Location:  Right Arm)   Pulse 99   Temp 98.4 F (36.9 C) (Oral)   Resp 18   SpO2 95%   Visual Acuity Right Eye Distance:   Left Eye Distance:   Bilateral Distance:    Right Eye Near:   Left Eye Near:    Bilateral Near:     Physical Exam Vitals and nursing note reviewed.  Constitutional:      General: She is not in acute distress.    Appearance: Normal appearance.  HENT:     Head: Normocephalic.     Right Ear: Tympanic membrane, ear canal and external ear normal.     Left Ear: Tympanic membrane, ear canal and external ear normal.     Nose: Congestion present.     Mouth/Throat:     Mouth: Mucous membranes are moist.     Pharynx: Posterior oropharyngeal erythema present. No oropharyngeal exudate.  Eyes:     Extraocular Movements: Extraocular movements intact.     Conjunctiva/sclera: Conjunctivae normal.     Pupils: Pupils are equal, round, and reactive to light.  Cardiovascular:     Rate and Rhythm: Normal rate and regular rhythm.     Pulses: Normal pulses.     Heart sounds: Normal heart sounds.  Pulmonary:     Effort: Pulmonary effort is normal.     Breath sounds: Normal breath sounds.  Abdominal:     General: Bowel sounds are normal.     Palpations: Abdomen is soft.  Musculoskeletal:        General: Normal range of  motion.     Cervical back: Normal range of motion.  Skin:    General: Skin is warm and dry.  Neurological:     General: No focal deficit present.     Mental Status: She is alert and oriented to person, place, and time.  Psychiatric:        Mood and Affect: Mood normal.        Behavior: Behavior normal.      UC Treatments / Results  Labs (all labs ordered are listed, but only abnormal results are displayed) Labs Reviewed - No data to display  EKG   Radiology No results found.  Procedures Procedures (including critical care time)  Medications Ordered in UC Medications - No data to display  Initial Impression / Assessment and Plan / UC Course  I have reviewed the triage vital signs and the nursing notes.  Pertinent labs & imaging results that were available during my care of the patient were reviewed by me and considered in my medical decision making (see chart for details).  Patient presents with cough due to asthma exacerbation after cleaning with ammonia and bleach.  It appears the patient's asthma was triggered by the use of the cleaning agents, causing an asthma exacerbation.  On exam, her lung sounds are clear, she is in no acute distress.  We will start patient on prednisone and provide an albuterol inhaler for her symptoms.  We will also provide Bromfed to help with her cough.  Supportive care recommendations were provided to the patient along with strict indications of when to go to the emergency department.  Patient advised to follow-up as needed. Final Clinical Impressions(s) / UC Diagnoses   Final diagnoses:  Persistent asthma with acute exacerbation, unspecified asthma severity  Cough, unspecified type     Discharge Instructions      Take medication as prescribed. Increase fluids and allow for plenty of rest. Recommend Tylenol or ibuprofen  as needed for pain, fever, or general discomfort. Recommend using a humidifier at bedtime during sleep to help with  cough and nasal congestion. Sleep elevated on 2 pillows while symptoms persist. Go to the emergency department if you develop worsening cough, shortness of breath, difficulty breathing, inability to speak in a complete sentence, or other concerns.     ED Prescriptions     Medication Sig Dispense Auth. Provider   predniSONE (DELTASONE) 20 MG tablet Take 2 tablets (40 mg total) by mouth daily with breakfast for 5 days. 10 tablet Trysten Berti-Warren, Alda Lea, NP   albuterol (VENTOLIN HFA) 108 (90 Base) MCG/ACT inhaler Inhale 2 puffs into the lungs every 6 (six) hours as needed for wheezing or shortness of breath. 8 g Ayzia Day-Warren, Alda Lea, NP   brompheniramine-pseudoephedrine-DM 30-2-10 MG/5ML syrup Take 5 mLs by mouth 4 (four) times daily as needed. 140 mL Makira Holleman-Warren, Alda Lea, NP      PDMP not reviewed this encounter.   Tish Men, NP 08/25/21 6073    Tish Men, NP 08/25/21 1128

## 2021-08-25 NOTE — ED Triage Notes (Signed)
Coughing since yesterday.  States she used ammonia to clean windows yesterday and started coughing with her throat burning.  States inhaler is not helping.  States she has been taking mucinex without relief.  States she does have some nasal congestion.

## 2021-08-25 NOTE — Discharge Instructions (Signed)
Take medication as prescribed. Increase fluids and allow for plenty of rest. Recommend Tylenol or ibuprofen as needed for pain, fever, or general discomfort. Recommend using a humidifier at bedtime during sleep to help with cough and nasal congestion. Sleep elevated on 2 pillows while symptoms persist. Go to the emergency department if you develop worsening cough, shortness of breath, difficulty breathing, inability to speak in a complete sentence, or other concerns.

## 2021-09-03 ENCOUNTER — Other Ambulatory Visit: Payer: Self-pay | Admitting: Family Medicine

## 2021-09-03 DIAGNOSIS — R928 Other abnormal and inconclusive findings on diagnostic imaging of breast: Secondary | ICD-10-CM

## 2021-09-23 ENCOUNTER — Ambulatory Visit
Admission: RE | Admit: 2021-09-23 | Discharge: 2021-09-23 | Disposition: A | Discharge status: Home / Self Care | Payer: BC Managed Care – PPO | Source: Ambulatory Visit | Attending: Family Medicine | Admitting: Family Medicine

## 2021-09-23 DIAGNOSIS — R928 Other abnormal and inconclusive findings on diagnostic imaging of breast: Secondary | ICD-10-CM

## 2022-04-30 ENCOUNTER — Other Ambulatory Visit: Payer: Self-pay | Admitting: Unknown Physician Specialty

## 2022-04-30 DIAGNOSIS — K1122 Acute recurrent sialoadenitis: Secondary | ICD-10-CM

## 2022-05-12 ENCOUNTER — Other Ambulatory Visit (HOSPITAL_COMMUNITY): Payer: Self-pay | Admitting: Unknown Physician Specialty

## 2022-05-12 DIAGNOSIS — K1122 Acute recurrent sialoadenitis: Secondary | ICD-10-CM

## 2022-06-09 ENCOUNTER — Other Ambulatory Visit: Payer: Self-pay | Admitting: Unknown Physician Specialty

## 2022-06-09 DIAGNOSIS — K1122 Acute recurrent sialoadenitis: Secondary | ICD-10-CM

## 2022-06-29 ENCOUNTER — Ambulatory Visit
Admission: RE | Admit: 2022-06-29 | Discharge: 2022-06-29 | Disposition: A | Discharge status: Home / Self Care | Payer: No Typology Code available for payment source | Source: Ambulatory Visit | Attending: Unknown Physician Specialty | Admitting: Unknown Physician Specialty

## 2022-06-29 DIAGNOSIS — K1122 Acute recurrent sialoadenitis: Secondary | ICD-10-CM

## 2022-06-29 MED ORDER — GADOBUTROL 1 MMOL/ML IV SOLN
9.0000 mL | Freq: Once | INTRAVENOUS | Status: AC | PRN
  Administered 2022-06-29: 9 mL via INTRAVENOUS

## 2022-07-03 ENCOUNTER — Other Ambulatory Visit: Payer: BC Managed Care – PPO

## 2022-11-24 ENCOUNTER — Other Ambulatory Visit: Payer: Self-pay | Admitting: Family Medicine

## 2022-11-24 DIAGNOSIS — Z1231 Encounter for screening mammogram for malignant neoplasm of breast: Secondary | ICD-10-CM

## 2022-12-06 ENCOUNTER — Ambulatory Visit
Admission: RE | Admit: 2022-12-06 | Discharge: 2022-12-06 | Disposition: A | Discharge status: Home / Self Care | Payer: No Typology Code available for payment source | Source: Ambulatory Visit | Attending: Family Medicine | Admitting: Family Medicine

## 2022-12-06 DIAGNOSIS — Z1231 Encounter for screening mammogram for malignant neoplasm of breast: Secondary | ICD-10-CM | POA: Insufficient documentation

## 2023-03-26 ENCOUNTER — Ambulatory Visit
Admission: EM | Admit: 2023-03-26 | Discharge: 2023-03-26 | Disposition: A | Discharge status: Home / Self Care | Payer: No Typology Code available for payment source | Attending: Nurse Practitioner | Admitting: Nurse Practitioner

## 2023-03-26 DIAGNOSIS — Z8709 Personal history of other diseases of the respiratory system: Secondary | ICD-10-CM | POA: Diagnosis not present

## 2023-03-26 DIAGNOSIS — U071 COVID-19: Secondary | ICD-10-CM | POA: Diagnosis not present

## 2023-03-26 LAB — POC COVID19/FLU A&B COMBO
Covid Antigen, POC: POSITIVE — AB
Influenza A Antigen, POC: NEGATIVE
Influenza B Antigen, POC: NEGATIVE

## 2023-03-26 MED ORDER — PREDNISONE 20 MG PO TABS: 20.0000 mg | ORAL_TABLET | Freq: Every day | ORAL | 0 refills | Status: AC

## 2023-03-26 MED ORDER — PAXLOVID (300/100) 20 X 150 MG & 10 X 100MG PO TBPK: 3.0000 | ORAL_TABLET | Freq: Two times a day (BID) | ORAL | 0 refills | Status: AC

## 2023-03-26 MED ORDER — PSEUDOEPH-BROMPHEN-DM 30-2-10 MG/5ML PO SYRP: 5.0000 mL | ORAL_SOLUTION | Freq: Four times a day (QID) | ORAL | 0 refills | Status: DC | PRN

## 2023-03-26 MED ORDER — CETIRIZINE HCL 10 MG PO TABS: 10.0000 mg | ORAL_TABLET | Freq: Every day | ORAL | 0 refills | Status: AC

## 2023-03-26 NOTE — ED Triage Notes (Signed)
Pt reports she has bilateral ear pain,, cough,headache, and nasal congestion x 2 days.   Not sure if her covid test was positive.

## 2023-03-26 NOTE — Discharge Instructions (Addendum)
Your COVID/flu test was positive for COVID. Take medication as prescribed.  You have been prescribed prednisone.  Monitor your blood glucose levels while you are taking the prednisone.  If your blood sugar exceeds 400, stop the medication immediately. Increase fluids and allow for plenty of rest. May take over-the-counter Tylenol as needed for pain, fever, general discomfort. Warm salt water gargles throughout the day to help with sore throat. Normal saline nasal spray for nasal congestion and runny nose.  You may use the nasal spray you have at home to help with the nasal congestion and runny nose. For the cough, recommend using a humidifier in the bedroom at nighttime during sleep and sleeping elevated on pillows while cough symptoms persist. You will need to remain home if you develop a fever.  You are able to return to your normal activities when you are fever free for 24 hours with no medication.  While you are taking the medication, continue to wear your mask.  If you continue to experience symptoms after completing the medication for COVID, you will need to wear your mask for an additional 5 days.  Follow-up in the emergency department immediately if you experience fevers, shortness of breath, difficulty breathing, or other concerns. Please notify your primary care physician of your recent positive COVID test. Follow-up as needed.

## 2023-03-26 NOTE — ED Provider Notes (Signed)
[facility]    CSN: [id number] Arrival date & time: 03/26/23  1233      History   Chief Complaint No chief complaint on file.   HPI Deborah Avila is a 63 y.o. female.   The history is provided by the patient.   Patient with a 2-day history of fatigue, chills, body aches, bilateral ear pain, headache, nasal congestion, and cough.  Patient states she took a home COVID test, but was not sure if it was positive because the test was expired.  States that she did see tubing lines.  Denies fever, ear drainage, wheezing, difficulty breathing, chest pain, abdominal pain, nausea, vomiting, diarrhea, or rash.  Patient reports she has been taking over-the-counter medications for her symptoms with minimal relief.  Patient states that she has had sick contacts at her job.  Most recent A1c was 8.0.  Patient also with underlying history of asthma.  Past Medical History:  Diagnosis Date   Asthma    Diabetes mellitus without complication (HCC)    GERD (gastroesophageal reflux disease)     Patient Active Problem List   Diagnosis Date Noted   Obesity, morbid (HCC) 03/10/2016   Hypoglycemia 11/05/2015   Essential hypertension 05/04/2015   BMI 39.0-39.9,adult 03/01/2015   Type 2 diabetes mellitus with hyperglycemia, with long-term current use of insulin (HCC) 02/28/2015    Past Surgical History:  Procedure Laterality Date   ABDOMINAL HYSTERECTOMY     APPENDECTOMY     BREAST BIOPSY Right 06/19/2019   [place] core coil clip ,BENIGN BREAST TISSUE WITH HYALINIZED   CATARACT EXTRACTION W/PHACO Right 11/26/2020   Procedure: CATARACT EXTRACTION PHACO AND INTRAOCULAR LENS PLACEMENT (IOC) RIGHT DIABETIC  eyhance toric lens 4.55 00:45.9;  Surgeon: Lockie Mola, MD;  Location: [facility];  Service: Ophthalmology;  Laterality: Right;  Diabetic   CHOLECYSTECTOMY     COLONOSCOPY WITH ESOPHAGOGASTRODUODENOSCOPY (EGD)     COLONOSCOPY WITH PROPOFOL N/A 07/02/2019   Procedure:  COLONOSCOPY WITH PROPOFOL;  Surgeon: Toledo, Boykin Nearing, MD;  Location: [facility];  Service: Gastroenterology;  Laterality: N/A;   ESOPHAGOGASTRODUODENOSCOPY (EGD) WITH PROPOFOL N/A 07/02/2019   Procedure: ESOPHAGOGASTRODUODENOSCOPY (EGD) WITH PROPOFOL;  Surgeon: Toledo, Boykin Nearing, MD;  Location: [facility];  Service: Gastroenterology;  Laterality: N/A;   TONSILLECTOMY      OB History   No obstetric history on file.      Home Medications    Prior to Admission medications   Medication Sig Start Date End Date Taking? Authorizing Provider  brompheniramine-pseudoephedrine-DM 30-2-10 MG/5ML syrup Take 5 mLs by mouth 4 (four) times daily as needed. 03/26/23  Yes Leath-Warren, Sadie Haber, NP  cetirizine (ZYRTEC) 10 MG tablet Take 1 tablet (10 mg total) by mouth daily. 03/26/23  Yes Leath-Warren, Sadie Haber, NP  nirmatrelvir/ritonavir (PAXLOVID, 300/100,) 20 x 150 MG & 10 x 100MG  TBPK Take 3 tablets by mouth 2 (two) times daily for 5 days. Take nirmatrelvir (150 mg) two tablets twice daily for 5 days and ritonavir (100 mg) one tablet twice daily for 5 days. 03/26/23 03/31/23 Yes Leath-Warren, Sadie Haber, NP  predniSONE (DELTASONE) 20 MG tablet Take 1 tablet (20 mg total) by mouth daily with breakfast for 7 days. 03/26/23 04/02/23 Yes Leath-Warren, Sadie Haber, NP  albuterol (VENTOLIN HFA) 108 (90 Base) MCG/ACT inhaler Inhale 2 puffs into the lungs every 6 (six) hours as needed for wheezing or shortness of breath. 08/25/21   Leath-Warren, Sadie Haber, NP  Apple Cider Vinegar 300 MG TABS Take 450 mg  by mouth in the morning and at bedtime.    [provider]  Blood Glucose Monitoring Suppl (GLUCOCOM BLOOD GLUCOSE MONITOR) DEVI Test 3 times daily Dx Code: E11.65 06/23/17   [provider]  busPIRone (BUSPAR) 5 MG tablet Take 5 mg by mouth 2 (two) times daily. 09/06/18   [provider]  Cinnamon 500 MG capsule Take 1,000 mg by mouth daily.    [provider]  empagliflozin  (JARDIANCE) 25 MG TABS tablet Take 25 mg by mouth daily.    [provider]  fluticasone (VERAMYST) 27.5 MCG/SPRAY nasal spray Place 2 sprays into the nose daily as needed for rhinitis.    [provider]  glucose blood test strip Test 3 times daily Dx Code: E11.65 06/23/17   [provider]  HUMALOG KWIKPEN 100 UNIT/ML KwikPen INJECT 10 UNITS INTO THE SKIN 3 TIMES A DAY BEFORE MEALS 06/14/18   [provider]  hydrOXYzine (ATARAX/VISTARIL) 25 MG tablet Take 1 tablet (25 mg total) by mouth every 6 (six) hours. Patient not taking: Reported on 11/17/2020 07/14/20   Moshe Cipro, FNP  Insulin Glargine (LANTUS SOLOSTAR) 100 UNIT/ML Solostar Pen 50 Units. 03/07/18   [provider]  Lancets (ACCU-CHEK MULTICLIX) lancets Test 3 times daily Dx Code: E11.65 06/23/17   [provider]  Karlene Einstein 290 MCG CAPS capsule  09/29/18   [provider]  lisinopril (ZESTRIL) 2.5 MG tablet  11/01/18   [provider]  magnesium oxide (MAG-OX) 400 MG tablet Take 400 mg by mouth 2 (two) times daily.    [provider]  meclizine (ANTIVERT) 25 MG tablet Take by mouth. Patient not taking: Reported on 11/17/2020 10/20/15   [provider]  metFORMIN (GLUCOPHAGE) 1000 MG tablet  09/29/18   [provider]  Multiple Vitamins-Minerals (MULTIVITAMIN ADULTS PO) Take by mouth daily.    [provider]  Na Sulfate-K Sulfate-Mg Sulf (SUPREP BOWEL PREP KIT) 17.5-3.13-1.6 GM/177ML SOLN Take 1 kit by mouth as directed. Patient not taking: Reported on 07/02/2019 11/13/18   Midge Minium, MD  Omega 3-6-9 Fatty Acids (OMEGA-3-6-9 PO) Take 1,030 mg by mouth 2 (two) times daily.    [provider]  omeprazole (PRILOSEC) 20 MG capsule Take by mouth daily. 07/27/18   [provider]  oxybutynin (DITROPAN-XL) 5 MG 24 hr tablet  04/09/15   [provider]  pravastatin (PRAVACHOL) 20 MG tablet  11/01/18   [provider]  [facility] 160-4.5 MCG/ACT inhaler  09/29/18   [provider]    Family History History reviewed. No pertinent family history.  Social History Social History   Tobacco Use   Smoking status: Never   Smokeless tobacco: Never  Vaping Use   Vaping status: Never Used  Substance Use Topics   Alcohol use: Never   Drug use: Never     Allergies   Iodinated contrast media, Morphine, Penicillins, Shellfish allergy, Codeine, and Sulfa antibiotics   Review of Systems Review of Systems Per HPI  Physical Exam Triage Vital Signs ED Triage Vitals  Encounter Vitals Group     BP 03/26/23 1445 121/79     Systolic BP Percentile --      Diastolic BP Percentile --      Pulse Rate 03/26/23 1445 94     Resp 03/26/23 1445 20     Temp 03/26/23 1445 98.8 F (37.1 C)     Temp Source 03/26/23 1445 Oral     SpO2 03/26/23 1445 94 %  Weight --      Height --      Head Circumference --      Peak Flow --      Pain Score 03/26/23 1443 8     Pain Loc --      Pain Education --      Exclude from Growth Chart --    No data found.  Updated Vital Signs BP 121/79 (BP Location: Right Arm)   Pulse 94   Temp 98.8 F (37.1 C) (Oral)   Resp 20   SpO2 94%   Visual Acuity Right Eye Distance:   Left Eye Distance:   Bilateral Distance:    Right Eye Near:   Left Eye Near:    Bilateral Near:     Physical Exam Vitals and nursing note reviewed.  Constitutional:      General: She is not in acute distress.    Appearance: Normal appearance.  HENT:     Head: Normocephalic.     Right Ear: Tympanic membrane, ear canal and external ear normal.     Left Ear: Tympanic membrane, ear canal and external ear normal.     Nose: Congestion present.     Right Turbinates: Enlarged and swollen.     Left Turbinates: Enlarged and swollen.     Right Sinus: No maxillary sinus tenderness or frontal sinus tenderness.     Left Sinus: No maxillary sinus tenderness or frontal sinus tenderness.      Mouth/Throat:     Lips: Pink.     Mouth: Mucous membranes are moist.     Pharynx: Uvula midline. Posterior oropharyngeal erythema and postnasal drip present. No oropharyngeal exudate or uvula swelling.  Eyes:     Extraocular Movements: Extraocular movements intact.     Conjunctiva/sclera: Conjunctivae normal.     Pupils: Pupils are equal, round, and reactive to light.  Cardiovascular:     Rate and Rhythm: Normal rate and regular rhythm.     Pulses: Normal pulses.     Heart sounds: Normal heart sounds.  Pulmonary:     Effort: Pulmonary effort is normal. No respiratory distress.     Breath sounds: Normal breath sounds. No stridor. No wheezing, rhonchi or rales.  Abdominal:     General: Bowel sounds are normal.     Palpations: Abdomen is soft.     Tenderness: There is no abdominal tenderness.  Musculoskeletal:     Cervical back: Normal range of motion.  Lymphadenopathy:     Cervical: No cervical adenopathy.  Skin:    General: Skin is warm and dry.  Neurological:     General: No focal deficit present.     Mental Status: She is alert and oriented to person, place, and time.  Psychiatric:        Mood and Affect: Mood normal.        Behavior: Behavior normal.      UC Treatments / Results  Labs (all labs ordered are listed, but only abnormal results are displayed) Labs Reviewed  POC COVID19/FLU A&B COMBO - Abnormal; Notable for the following components:      Result Value   Covid Antigen, POC Positive (*)    All other components within normal limits    EKG   Radiology No results found.  Procedures Procedures (including critical care time)  Medications Ordered in UC Medications - No data to display  Initial Impression / Assessment and Plan / UC Course  I have reviewed the triage vital signs and the  nursing notes.  Pertinent labs & imaging results that were available during my care of the patient were reviewed by me and considered in my medical decision making (see  chart for details).  On exam, lung sounds are clear throughout, room air sats at 94%.  COVID/flu test was positive for COVID.  Will start patient on Paxlovid.  Symptomatic treatment provided with Bromfed-DM for the cough, and cetirizine 10 mg as an antihistamine.  For patient's underlying history of asthma, will treat with prednisone 20 mg for the next 7 days.  Discussed with patient to monitor blood glucose levels, discontinue prednisone if blood glucose exceeds 400.  Supportive care recommendations were provided and discussed with the patient to include fluids, rest, over-the-counter analgesics, normal saline nasal spray, and use of a humidifier during sleep.  Discussed indications for patient to follow-up in the emergency department.  Patient was in agreement with this plan of care and verbalizes understanding.  All questions were answered.  Patient stable for discharge.  Work note was provided.   Final Clinical Impressions(s) / UC Diagnoses   Final diagnoses:  COVID  History of asthma     Discharge Instructions      Your COVID/flu test was positive for COVID. Take medication as prescribed.  You have been prescribed prednisone.  Monitor your blood glucose levels while you are taking the prednisone.  If your blood sugar exceeds 400, stop the medication immediately. Increase fluids and allow for plenty of rest. May take over-the-counter Tylenol as needed for pain, fever, general discomfort. Warm salt water gargles throughout the day to help with sore throat. Normal saline nasal spray for nasal congestion and runny nose.  You may use the nasal spray you have at home to help with the nasal congestion and runny nose. For the cough, recommend using a humidifier in the bedroom at nighttime during sleep and sleeping elevated on pillows while cough symptoms persist. You will need to remain home if you develop a fever.  You are able to return to your normal activities when you are fever free for 24  hours with no medication.  While you are taking the medication, continue to wear your mask.  If you continue to experience symptoms after completing the medication for COVID, you will need to wear your mask for an additional 5 days.  Follow-up in the emergency department immediately if you experience fevers, shortness of breath, difficulty breathing, or other concerns. Please notify your primary care physician of your recent positive COVID test. Follow-up as needed.       ED Prescriptions     Medication Sig Dispense Auth. Provider   nirmatrelvir/ritonavir (PAXLOVID, 300/100,) 20 x 150 MG & 10 x 100MG  TBPK Take 3 tablets by mouth 2 (two) times daily for 5 days. Take nirmatrelvir (150 mg) two tablets twice daily for 5 days and ritonavir (100 mg) one tablet twice daily for 5 days. 30 tablet Leath-Warren, Sadie Haber, NP   brompheniramine-pseudoephedrine-DM 30-2-10 MG/5ML syrup Take 5 mLs by mouth 4 (four) times daily as needed. 140 mL Leath-Warren, Sadie Haber, NP   cetirizine (ZYRTEC) 10 MG tablet Take 1 tablet (10 mg total) by mouth daily. 30 tablet Leath-Warren, Sadie Haber, NP   predniSONE (DELTASONE) 20 MG tablet Take 1 tablet (20 mg total) by mouth daily with breakfast for 7 days. 7 tablet Leath-Warren, Sadie Haber, NP      PDMP not reviewed this encounter.   Abran Cantor, NP 03/26/23 1511

## 2023-07-21 ENCOUNTER — Encounter: Payer: Self-pay | Admitting: Ophthalmology

## 2023-07-21 NOTE — Anesthesia Preprocedure Evaluation (Addendum)
 Anesthesia Evaluation  Patient identified by MRN, date of birth, ID band Patient awake    Reviewed: Allergy & Precautions, H&P , NPO status , Patient's Chart, lab work & pertinent test results  Airway Mallampati: II  TM Distance: >3 FB Neck ROM: Full    Dental no notable dental hx.    Pulmonary neg pulmonary ROS, asthma    Pulmonary exam normal breath sounds clear to auscultation       Cardiovascular hypertension, negative cardio ROS Normal cardiovascular exam Rhythm:Regular Rate:Normal     Neuro/Psych negative neurological ROS  negative psych ROS   GI/Hepatic negative GI ROS, Neg liver ROS,GERD  ,,  Endo/Other  diabetes    Renal/GU negative Renal ROS  negative genitourinary   Musculoskeletal negative musculoskeletal ROS (+)    Abdominal   Peds negative pediatric ROS (+)  Hematology negative hematology ROS (+)   Anesthesia Other Findings Previous cataract surgery 11-26-20  Diabetes mellitus without complication GERD (gastroesophageal reflux disease) Asthma     Reproductive/Obstetrics negative OB ROS                              Anesthesia Physical Anesthesia Plan  ASA: 2  Anesthesia Plan: MAC   Post-op Pain Management:    Induction: Intravenous  PONV Risk Score and Plan:   Airway Management Planned: Natural Airway and Nasal Cannula  Additional Equipment:   Intra-op Plan:   Post-operative Plan:   Informed Consent: I have reviewed the patients History and Physical, chart, labs and discussed the procedure including the risks, benefits and alternatives for the proposed anesthesia with the patient or authorized representative who has indicated his/her understanding and acceptance.     Dental Advisory Given  Plan Discussed with: Anesthesiologist, CRNA and Surgeon  Anesthesia Plan Comments: (Patient consented for risks of anesthesia including but not limited to:  -  adverse reactions to medications - damage to eyes, teeth, lips or other oral mucosa - nerve damage due to positioning  - sore throat or hoarseness - Damage to heart, brain, nerves, lungs, other parts of body or loss of life  Patient voiced understanding and assent.)         Anesthesia Quick Evaluation

## 2023-07-26 NOTE — Discharge Instructions (Signed)

## 2023-07-27 ENCOUNTER — Encounter: Payer: Self-pay | Admitting: Ophthalmology

## 2023-07-27 ENCOUNTER — Encounter
Admission: RE | Disposition: A | Discharge status: Home / Self Care | Payer: Self-pay | Source: Home / Self Care | Attending: Ophthalmology

## 2023-07-27 ENCOUNTER — Other Ambulatory Visit: Payer: Self-pay

## 2023-07-27 ENCOUNTER — Ambulatory Visit: Payer: Self-pay | Admitting: Anesthesiology

## 2023-07-27 ENCOUNTER — Ambulatory Visit
Admission: RE | Admit: 2023-07-27 | Discharge: 2023-07-27 | Disposition: A | Discharge status: Home / Self Care | Attending: Ophthalmology | Admitting: Ophthalmology

## 2023-07-27 DIAGNOSIS — Z794 Long term (current) use of insulin: Secondary | ICD-10-CM | POA: Diagnosis not present

## 2023-07-27 DIAGNOSIS — H2512 Age-related nuclear cataract, left eye: Secondary | ICD-10-CM | POA: Diagnosis present

## 2023-07-27 DIAGNOSIS — Z961 Presence of intraocular lens: Secondary | ICD-10-CM | POA: Diagnosis not present

## 2023-07-27 DIAGNOSIS — I10 Essential (primary) hypertension: Secondary | ICD-10-CM | POA: Diagnosis not present

## 2023-07-27 DIAGNOSIS — K219 Gastro-esophageal reflux disease without esophagitis: Secondary | ICD-10-CM | POA: Insufficient documentation

## 2023-07-27 DIAGNOSIS — J45909 Unspecified asthma, uncomplicated: Secondary | ICD-10-CM | POA: Insufficient documentation

## 2023-07-27 DIAGNOSIS — E1136 Type 2 diabetes mellitus with diabetic cataract: Secondary | ICD-10-CM | POA: Insufficient documentation

## 2023-07-27 DIAGNOSIS — Z7951 Long term (current) use of inhaled steroids: Secondary | ICD-10-CM | POA: Diagnosis not present

## 2023-07-27 DIAGNOSIS — Z9841 Cataract extraction status, right eye: Secondary | ICD-10-CM | POA: Diagnosis not present

## 2023-07-27 DIAGNOSIS — Z79899 Other long term (current) drug therapy: Secondary | ICD-10-CM | POA: Diagnosis not present

## 2023-07-27 DIAGNOSIS — Z7984 Long term (current) use of oral hypoglycemic drugs: Secondary | ICD-10-CM | POA: Insufficient documentation

## 2023-07-27 HISTORY — PX: CATARACT EXTRACTION W/PHACO: SHX586

## 2023-07-27 LAB — GLUCOSE, CAPILLARY: Glucose-Capillary: 239 mg/dL — ABNORMAL HIGH (ref 70–99)

## 2023-07-27 SURGERY — PHACOEMULSIFICATION, CATARACT, WITH IOL INSERTION
Anesthesia: Monitor Anesthesia Care | Site: Eye | Laterality: Left

## 2023-07-27 MED ORDER — SIGHTPATH DOSE#1 BSS IO SOLN
INTRAOCULAR | Status: DC | PRN
  Administered 2023-07-27: 15 mL

## 2023-07-27 MED ORDER — TETRACAINE HCL 0.5 % OP SOLN
OPHTHALMIC | Status: AC
  Filled 2023-07-27: qty 4

## 2023-07-27 MED ORDER — ARMC OPHTHALMIC DILATING DROPS
OPHTHALMIC | Status: AC
  Filled 2023-07-27: qty 0.5

## 2023-07-27 MED ORDER — ARMC OPHTHALMIC DILATING DROPS
1.0000 | OPHTHALMIC | Status: DC | PRN
  Administered 2023-07-27 (×3): 1 via OPHTHALMIC

## 2023-07-27 MED ORDER — FENTANYL CITRATE (PF) 100 MCG/2ML IJ SOLN
INTRAMUSCULAR | Status: AC
Start: 2023-07-27 — End: ?
  Filled 2023-07-27: qty 2

## 2023-07-27 MED ORDER — EPINEPHRINE PF 1 MG/ML IJ SOLN
INTRAMUSCULAR | Status: DC | PRN
  Administered 2023-07-27: 70 mL via OPHTHALMIC

## 2023-07-27 MED ORDER — MIDAZOLAM HCL 2 MG/2ML IJ SOLN
INTRAMUSCULAR | Status: DC | PRN
  Administered 2023-07-27 (×2): 1 mg via INTRAVENOUS

## 2023-07-27 MED ORDER — SIGHTPATH DOSE#1 NA HYALUR & NA CHOND-NA HYALUR IO KIT
PACK | INTRAOCULAR | Status: DC | PRN
  Administered 2023-07-27: 1 via OPHTHALMIC

## 2023-07-27 MED ORDER — MIDAZOLAM HCL 2 MG/2ML IJ SOLN
INTRAMUSCULAR | Status: AC
  Filled 2023-07-27: qty 2

## 2023-07-27 MED ORDER — BRIMONIDINE TARTRATE-TIMOLOL 0.2-0.5 % OP SOLN
OPHTHALMIC | Status: DC | PRN
Start: 2023-07-27 — End: 2023-07-27
  Administered 2023-07-27: 1 [drp] via OPHTHALMIC

## 2023-07-27 MED ORDER — MOXIFLOXACIN HCL 0.5 % OP SOLN
OPHTHALMIC | Status: DC | PRN
  Administered 2023-07-27: .2 mL via OPHTHALMIC

## 2023-07-27 MED ORDER — TETRACAINE HCL 0.5 % OP SOLN
1.0000 [drp] | OPHTHALMIC | Status: DC | PRN
  Administered 2023-07-27 (×3): 1 [drp] via OPHTHALMIC

## 2023-07-27 MED ORDER — LIDOCAINE HCL (PF) 2 % IJ SOLN
INTRAOCULAR | Status: DC | PRN
  Administered 2023-07-27: 1 mL

## 2023-07-27 MED ORDER — LACTATED RINGERS IV SOLN: INTRAVENOUS | Status: DC

## 2023-07-27 MED ORDER — FENTANYL CITRATE (PF) 100 MCG/2ML IJ SOLN
INTRAMUSCULAR | Status: DC | PRN
  Administered 2023-07-27 (×2): 50 ug via INTRAVENOUS

## 2023-07-27 SURGICAL SUPPLY — 19 items
BNDG EYE OVAL 2 1/8 X 2 5/8 (GAUZE/BANDAGES/DRESSINGS) IMPLANT
CANNULA ANT/CHMB 27G (MISCELLANEOUS) IMPLANT
CANNULA ANT/CHMB 27GA (MISCELLANEOUS) IMPLANT
CATARACT SUITE SIGHTPATH (MISCELLANEOUS) ×1 IMPLANT
FEE CATARACT SUITE SIGHTPATH (MISCELLANEOUS) ×1 IMPLANT
GLOVE BIOGEL PI IND STRL 8 (GLOVE) ×1 IMPLANT
GLOVE PI ULTRA LF STRL 7.5 (GLOVE) IMPLANT
GLOVE SURG LX STRL 7.5 STRW (GLOVE) ×1 IMPLANT
GLOVE SURG PROTEXIS BL SZ6.5 (GLOVE) ×1 IMPLANT
GLOVE SURG SYN 6.5 PF PI BL (GLOVE) ×1 IMPLANT
LENS IOL EYHANCE TRC 225 21.0 IMPLANT
NDL FILTER BLUNT 18X1 1/2 (NEEDLE) ×1 IMPLANT
NDL RETROBULBAR .5 NSTRL (NEEDLE) IMPLANT
NEEDLE FILTER BLUNT 18X1 1/2 (NEEDLE) ×1 IMPLANT
PACK VIT ANT 23G (MISCELLANEOUS) IMPLANT
RING MALYGIN 7.0 (MISCELLANEOUS) IMPLANT
SUT VICRYL 9 0 (SUTURE) IMPLANT
SUTURE EHLN 10-0 CS-B-6CS-B-6 (SUTURE) IMPLANT
SYR 3ML LL SCALE MARK (SYRINGE) ×1 IMPLANT

## 2023-07-27 NOTE — H&P (Signed)
 [facility]   Primary Care Physician:  Evelena Hines, FNP (Inactive) Ophthalmologist: Dr. Annell Kidney  Pre-Procedure History & Physical: HPI:  Deborah Avila is a 63 y.o. female here for ophthalmic surgery.   Past Medical History:  Diagnosis Date   Asthma    Diabetes mellitus without complication (HCC)    GERD (gastroesophageal reflux disease)     Past Surgical History:  Procedure Laterality Date   ABDOMINAL HYSTERECTOMY     APPENDECTOMY     BREAST BIOPSY Right 06/19/2019   us  core coil clip ,BENIGN BREAST TISSUE WITH HYALINIZED   CATARACT EXTRACTION W/PHACO Right 11/26/2020   Procedure: CATARACT EXTRACTION PHACO AND INTRAOCULAR LENS PLACEMENT (IOC) RIGHT DIABETIC  eyhance toric lens 4.55 00:45.9;  Surgeon: Annell Kidney, MD;  Location: [facility];  Service: Ophthalmology;  Laterality: Right;  Diabetic   CHOLECYSTECTOMY     COLONOSCOPY WITH ESOPHAGOGASTRODUODENOSCOPY (EGD)     COLONOSCOPY WITH PROPOFOL  N/A 07/02/2019   Procedure: COLONOSCOPY WITH PROPOFOL ;  Surgeon: Toledo, Alphonsus Jeans, MD;  Location: [facility];  Service: Gastroenterology;  Laterality: N/A;   ESOPHAGOGASTRODUODENOSCOPY (EGD) WITH PROPOFOL  N/A 07/02/2019   Procedure: ESOPHAGOGASTRODUODENOSCOPY (EGD) WITH PROPOFOL ;  Surgeon: Toledo, Alphonsus Jeans, MD;  Location: [facility];  Service: Gastroenterology;  Laterality: N/A;   TONSILLECTOMY      Prior to Admission medications   Medication Sig Start Date End Date Taking? Authorizing Provider  atorvastatin (LIPITOR) 40 MG tablet Take 40 mg by mouth at bedtime.   Yes [provider]  Blood Glucose Monitoring Suppl (GLUCOCOM BLOOD GLUCOSE MONITOR) DEVI Test 3 times daily Dx Code: E11.65 06/23/17  Yes [provider]  busPIRone (BUSPAR) 5 MG tablet Take 5 mg by mouth 2 (two) times daily. 09/06/18  Yes [provider]  cetirizine  (ZYRTEC ) 10 MG tablet Take 1 tablet (10 mg total) by mouth daily. 03/26/23  Yes  Leath-Warren, Belen Bowers, NP  Cinnamon 500 MG capsule Take 1,000 mg by mouth daily.   Yes [provider]  Dulaglutide (TRULICITY) 1.5 MG/0.5ML SOAJ Inject into the skin once a week. Tuesdays   Yes [provider]  empagliflozin (JARDIANCE) 25 MG TABS tablet Take 25 mg by mouth daily.   Yes [provider]  EPINEPHrine  0.3 mg/0.3 mL IJ SOAJ injection Inject 0.3 mg into the muscle as needed for anaphylaxis.   Yes [provider]  famotidine (PEPCID) 40 MG tablet Take 40 mg by mouth at bedtime as needed for heartburn or indigestion.   Yes [provider]  GLUCAGON, RDNA, IJ Inject as directed as needed.   Yes [provider]  glucose blood test strip Test 3 times daily Dx Code: E11.65 06/23/17  Yes [provider]  HUMALOG KWIKPEN 100 UNIT/ML KwikPen INJECT 10 UNITS INTO THE SKIN 3 TIMES A DAY BEFORE MEALS 06/14/18  Yes [provider]  Insulin Glargine (LANTUS SOLOSTAR) 100 UNIT/ML Solostar Pen 40 Units at bedtime. 03/07/18  Yes [provider]  Lancets (ACCU-CHEK MULTICLIX) lancets Test 3 times daily Dx Code: E11.65 06/23/17  Yes [provider]  Glory Larsen 290 MCG CAPS capsule  09/29/18  Yes [provider]  lisinopril (ZESTRIL) 2.5 MG tablet  11/01/18  Yes [provider]  magnesium oxide (MAG-OX) 400 MG tablet Take 400 mg by mouth 2 (two) times daily.   Yes [provider]  metFORMIN (GLUCOPHAGE) 1000 MG tablet Take 1,000 mg by mouth 2 (two) times daily with a meal. 09/29/18  Yes [provider]  montelukast (SINGULAIR) 10 MG tablet Take 10 mg by mouth daily.   Yes [provider]  Multiple Vitamins-Minerals (MULTIVITAMIN ADULTS PO) Take by mouth daily.   Yes [provider]  omeprazole (PRILOSEC) 20 MG capsule Take by mouth daily. 07/27/18  Yes [provider]  ondansetron (ZOFRAN) 8 MG tablet Take by mouth every 8 (eight) hours as needed for nausea or  vomiting.   Yes [provider]  albuterol  (VENTOLIN  HFA) 108 (90 Base) MCG/ACT inhaler Inhale 2 puffs into the lungs every 6 (six) hours as needed for wheezing or shortness of breath. Patient not taking: Reported on 07/21/2023 08/25/21   Leath-Warren, Belen Bowers, NP  SYMBICORT 160-4.5 MCG/ACT inhaler  09/29/18   [provider]    Allergies as of 07/12/2023 - Review Complete 03/26/2023  Allergen Reaction Noted   Iodinated contrast media Anaphylaxis 02/28/2015   Morphine Anaphylaxis 02/28/2015   Penicillins Anaphylaxis 02/28/2015   Shellfish allergy Anaphylaxis 07/02/2019   Codeine Rash and Swelling 02/28/2015   Sulfa antibiotics Rash and Swelling 02/28/2015    History reviewed. No pertinent family history.  Social History   Socioeconomic History   Marital status: Divorced    Spouse name: Not on file   Number of children: Not on file   Years of education: Not on file   Highest education level: Not on file  Occupational History   Not on file  Tobacco Use   Smoking status: Never   Smokeless tobacco: Never  Vaping Use   Vaping status: Never Used  Substance and Sexual Activity   Alcohol use: Never   Drug use: Never   Sexual activity: Not on file  Other Topics Concern   Not on file  Social History Narrative   Not on file   Social Drivers of Health   Financial Resource Strain: Not on file  Food Insecurity: Not on file  Transportation Needs: Not on file  Physical Activity: Not on file  Stress: Not on file  Social Connections: Not on file  Intimate Partner Violence: Not on file    Review of Systems: See HPI, otherwise negative ROS  Physical Exam: BP 133/81   Pulse 97   Temp 97.7 F (36.5 C) (Temporal)   Resp (!) 22   Ht 5\' 1"  (1.549 m)   Wt 83.1 kg   SpO2 96%   BMI 34.63 kg/m  General:   Alert,  pleasant and cooperative in NAD Head:  Normocephalic and atraumatic. Lungs:  Clear to auscultation.    Heart:  Regular rate and rhythm.    Impression/Plan: Deborah Avila is here for ophthalmic surgery.  Risks, benefits, limitations, and alternatives regarding ophthalmic surgery have been reviewed with the patient.  Questions have been answered.  All parties agreeable.   Annell Kidney, MD  07/27/2023, 10:33 AM

## 2023-07-27 NOTE — Anesthesia Postprocedure Evaluation (Signed)
 Anesthesia Post Note  Patient: Deborah Avila  Procedure(s) Performed: PHACOEMULSIFICATION, CATARACT, WITH IOL INSERTION (Left: Eye)  Patient location during evaluation: PACU Anesthesia Type: MAC Level of consciousness: awake and alert Pain management: pain level controlled Vital Signs Assessment: post-procedure vital signs reviewed and stable Respiratory status: spontaneous breathing, nonlabored ventilation, respiratory function stable and patient connected to nasal cannula oxygen Cardiovascular status: stable and blood pressure returned to baseline Postop Assessment: no apparent nausea or vomiting Anesthetic complications: no   No notable events documented.   Last Vitals:  Vitals:   07/27/23 1125 07/27/23 1130  BP: 118/69 121/64  Pulse: 88   Resp: 11   Temp: 36.6 C (!) 36.4 C  SpO2: 98%     Last Pain:  Vitals:   07/27/23 1130  TempSrc:   PainSc: 0-No pain                 Kiersten Coss C Iyad Deroo

## 2023-07-27 NOTE — Op Note (Signed)
 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   PROCEDURE:  Phacoemulsification with Toric posterior chamber intraocular lens placement of the left eye.  Ultrasound time: Procedure(s) with comments: PHACOEMULSIFICATION, CATARACT, WITH IOL INSERTION (Left) - 5.90 0:44.0  LENS:   Implant Name Type Inv. Item Serial No. Manufacturer Lot No. LRB No. Used Action  LENS IOL EYHANCE TRC 225 21.0 - [id number]  LENS IOL EYHANCE TRC 225 21.0 [id number] [employer]  Left 1 Implanted     [id number] Toric intraocular lens with 2.25 diopters of cylindrical power with axis orientation at 95 degrees.     SURGEON:  Berline Brenner, MD   ANESTHESIA:  Topical with tetracaine  drops and 2% Xylocaine  jelly, augmented with 1% preservative-free intracameral lidocaine .  COMPLICATIONS:  None.   DESCRIPTION OF PROCEDURE:  The patient was identified in the holding room and transported to the operating suite and placed in the supine position under the operating microscope.  The left eye was identified as the operative eye, and it was prepped and draped in the usual sterile ophthalmic fashion.    A clear-corneal paracentesis incision was made at the 1:30 position.  0.5 ml of preservative-free 1% lidocaine  was injected into the anterior chamber. The anterior chamber was filled with Viscoat.  A 2.4 millimeter near clear corneal incision was then made at the 10:30 position.  A cystotome and capsulorrhexis forceps were then used to make a curvilinear capsulorrhexis.  Hydrodissection and hydrodelineation were then performed using balanced salt solution.   Phacoemulsification was then used in stop and chop fashion to remove the lens, nucleus and epinucleus.  The remaining cortex was aspirated using the irrigation and aspiration handpiece.  Provisc viscoelastic was then placed into the  capsular bag to distend it for lens placement.  The Verion digital marker was used to align the implant at the intended axis.   A Toric lens was then injected into the capsular bag.  It was rotated clockwise until the axis marks on the lens were approximately 15 degrees in the counterclockwise direction to the intended alignment.  The viscoelastic was aspirated from the eye using the irrigation aspiration handpiece.  Then, a Koch spatula through the sideport incision was used to rotate the lens in a clockwise direction until the axis markings of the intraocular lens were lined up with the Verion alignment.  Balanced salt solution was then used to hydrate the wounds. Vigamox  0.2 ml of a 1mg  per ml solution was injected into the anterior chamber for a dose of 0.2 mg of intracameral antibiotic at the completion of the case.    The eye was noted to have a physiologic pressure and there was no wound leak noted.   Timolol  and Brimonidine  drops were applied to the eye.  The patient was taken to the recovery room in stable condition having had no complications of anesthesia or surgery.  Dorota Heinrichs 07/27/2023, 11:22 AM

## 2023-07-27 NOTE — Transfer of Care (Signed)
 Immediate Anesthesia Transfer of Care Note  Patient: Deborah Avila  Procedure(s) Performed: PHACOEMULSIFICATION, CATARACT, WITH IOL INSERTION (Left: Eye)  Patient Location: PACU  Anesthesia Type: MAC  Level of Consciousness: awake, alert  and patient cooperative  Airway and Oxygen Therapy: Patient Spontanous Breathing and Patient connected to supplemental oxygen  Post-op Assessment: Post-op Vital signs reviewed, Patient's Cardiovascular Status Stable, Respiratory Function Stable, Patent Airway and No signs of Nausea or vomiting  Post-op Vital Signs: Reviewed and stable  Complications: No notable events documented.

## 2023-07-28 ENCOUNTER — Encounter: Payer: Self-pay | Admitting: Ophthalmology

## 2023-10-31 ENCOUNTER — Other Ambulatory Visit: Payer: Self-pay | Admitting: Family Medicine

## 2023-10-31 DIAGNOSIS — Z1231 Encounter for screening mammogram for malignant neoplasm of breast: Secondary | ICD-10-CM

## 2023-12-07 ENCOUNTER — Ambulatory Visit
Admission: RE | Admit: 2023-12-07 | Discharge: 2023-12-07 | Disposition: A | Discharge status: Home / Self Care | Source: Ambulatory Visit | Attending: Family Medicine | Admitting: Family Medicine

## 2023-12-07 DIAGNOSIS — Z1231 Encounter for screening mammogram for malignant neoplasm of breast: Secondary | ICD-10-CM | POA: Insufficient documentation
